# Patient Record
Sex: Female | Born: 1995 | Race: Black or African American | Hispanic: No | Marital: Single | Smoking: Never smoker
Health system: Southern US, Community
[De-identification: ages and names within clinical notes are randomized; demographics above are authoritative.]

## PROBLEM LIST (undated history)

## (undated) DIAGNOSIS — L259 Unspecified contact dermatitis, unspecified cause: Secondary | ICD-10-CM

## (undated) DIAGNOSIS — R519 Headache, unspecified: Secondary | ICD-10-CM

## (undated) DIAGNOSIS — N840 Polyp of corpus uteri: Secondary | ICD-10-CM

## (undated) DIAGNOSIS — T7840XA Allergy, unspecified, initial encounter: Secondary | ICD-10-CM

## (undated) DIAGNOSIS — L309 Dermatitis, unspecified: Secondary | ICD-10-CM

## (undated) DIAGNOSIS — K219 Gastro-esophageal reflux disease without esophagitis: Secondary | ICD-10-CM

## (undated) DIAGNOSIS — D649 Anemia, unspecified: Secondary | ICD-10-CM

## (undated) HISTORY — PX: NO PAST SURGERIES: SHX2092

## (undated) HISTORY — DX: Allergy, unspecified, initial encounter: T78.40XA

## (undated) HISTORY — DX: Dermatitis, unspecified: L30.9

---

## 2016-10-30 ENCOUNTER — Emergency Department (HOSPITAL_COMMUNITY)
Admission: EM | Admit: 2016-10-30 | Discharge: 2016-10-30 | Disposition: A | Discharge status: Home / Self Care | Payer: Commercial Managed Care - PPO | Attending: Emergency Medicine | Admitting: Emergency Medicine

## 2016-10-30 ENCOUNTER — Emergency Department (HOSPITAL_COMMUNITY): Payer: Commercial Managed Care - PPO

## 2016-10-30 ENCOUNTER — Encounter (HOSPITAL_COMMUNITY): Payer: Self-pay | Admitting: Emergency Medicine

## 2016-10-30 DIAGNOSIS — R1013 Epigastric pain: Secondary | ICD-10-CM | POA: Diagnosis present

## 2016-10-30 DIAGNOSIS — R0602 Shortness of breath: Secondary | ICD-10-CM | POA: Insufficient documentation

## 2016-10-30 DIAGNOSIS — R112 Nausea with vomiting, unspecified: Secondary | ICD-10-CM | POA: Diagnosis not present

## 2016-10-30 DIAGNOSIS — R079 Chest pain, unspecified: Secondary | ICD-10-CM | POA: Insufficient documentation

## 2016-10-30 DIAGNOSIS — R197 Diarrhea, unspecified: Secondary | ICD-10-CM

## 2016-10-30 LAB — CBC
HEMATOCRIT: 40.8 % (ref 36.0–46.0)
Hemoglobin: 14 g/dL (ref 12.0–15.0)
MCH: 29 pg (ref 26.0–34.0)
MCHC: 34.3 g/dL (ref 30.0–36.0)
MCV: 84.6 fL (ref 78.0–100.0)
PLATELETS: 339 10*3/uL (ref 150–400)
RBC: 4.82 MIL/uL (ref 3.87–5.11)
RDW: 13.4 % (ref 11.5–15.5)
WBC: 9.3 10*3/uL (ref 4.0–10.5)

## 2016-10-30 LAB — URINALYSIS, ROUTINE W REFLEX MICROSCOPIC
BILIRUBIN URINE: NEGATIVE
GLUCOSE, UA: NEGATIVE mg/dL
HGB URINE DIPSTICK: NEGATIVE
KETONES UR: 5 mg/dL — AB
Leukocytes, UA: NEGATIVE
Nitrite: NEGATIVE
Protein, ur: NEGATIVE mg/dL
SPECIFIC GRAVITY, URINE: 1.024 (ref 1.005–1.030)
pH: 5 (ref 5.0–8.0)

## 2016-10-30 LAB — COMPREHENSIVE METABOLIC PANEL
ALT: 13 U/L — AB (ref 14–54)
AST: 19 U/L (ref 15–41)
Albumin: 4.6 g/dL (ref 3.5–5.0)
Alkaline Phosphatase: 49 U/L (ref 38–126)
Anion gap: 7 (ref 5–15)
BUN: 14 mg/dL (ref 6–20)
CHLORIDE: 107 mmol/L (ref 101–111)
CO2: 27 mmol/L (ref 22–32)
CREATININE: 0.79 mg/dL (ref 0.44–1.00)
Calcium: 9.5 mg/dL (ref 8.9–10.3)
Glucose, Bld: 90 mg/dL (ref 65–99)
POTASSIUM: 3.9 mmol/L (ref 3.5–5.1)
SODIUM: 141 mmol/L (ref 135–145)
Total Bilirubin: 0.7 mg/dL (ref 0.3–1.2)
Total Protein: 8.1 g/dL (ref 6.5–8.1)

## 2016-10-30 LAB — I-STAT TROPONIN, ED: Troponin i, poc: 0 ng/mL (ref 0.00–0.08)

## 2016-10-30 LAB — LIPASE, BLOOD: LIPASE: 22 U/L (ref 11–51)

## 2016-10-30 LAB — I-STAT BETA HCG BLOOD, ED (MC, WL, AP ONLY): I-stat hCG, quantitative: <5 m[IU]/mL (ref <5)

## 2016-10-30 MED ORDER — ONDANSETRON 4 MG PO TBDP
4.0000 mg | ORAL_TABLET | Freq: Once | ORAL | Status: DC | PRN
  Filled 2016-10-30 (×2): qty 1

## 2016-10-30 MED ORDER — DICYCLOMINE HCL 20 MG PO TABS
20.0000 mg | ORAL_TABLET | Freq: Two times a day (BID) | ORAL | 0 refills | Status: DC
Start: 2016-10-30 — End: 2018-02-01

## 2016-10-30 MED ORDER — GI COCKTAIL ~~LOC~~
30.0000 mL | Freq: Once | ORAL | Status: AC
  Administered 2016-10-30: 30 mL via ORAL
  Filled 2016-10-30: qty 30

## 2016-10-30 MED ORDER — ONDANSETRON HCL 4 MG PO TABS: 4.0000 mg | ORAL_TABLET | Freq: Four times a day (QID) | ORAL | 0 refills | Status: DC

## 2016-10-30 MED ORDER — OMEPRAZOLE 20 MG PO CPDR: 20.0000 mg | DELAYED_RELEASE_CAPSULE | Freq: Every day | ORAL | 0 refills | Status: DC

## 2016-10-30 MED ORDER — DICYCLOMINE HCL 10 MG/ML IM SOLN
20.0000 mg | Freq: Once | INTRAMUSCULAR | Status: AC
  Administered 2016-10-30: 20 mg via INTRAMUSCULAR
  Filled 2016-10-30: qty 2

## 2016-10-30 MED ORDER — KETOROLAC TROMETHAMINE 30 MG/ML IJ SOLN
15.0000 mg | Freq: Once | INTRAMUSCULAR | Status: AC
  Administered 2016-10-30: 15 mg via INTRAVENOUS
  Filled 2016-10-30: qty 1

## 2016-10-30 MED ORDER — FAMOTIDINE IN NACL 20-0.9 MG/50ML-% IV SOLN
20.0000 mg | Freq: Once | INTRAVENOUS | Status: AC
  Administered 2016-10-30: 20 mg via INTRAVENOUS
  Filled 2016-10-30: qty 50

## 2016-10-30 MED ORDER — METOCLOPRAMIDE HCL 5 MG/ML IJ SOLN
10.0000 mg | Freq: Once | INTRAMUSCULAR | Status: AC
  Administered 2016-10-30: 10 mg via INTRAVENOUS
  Filled 2016-10-30: qty 2

## 2016-10-30 MED ORDER — SUCRALFATE 1 GM/10ML PO SUSP: 1.0000 g | Freq: Three times a day (TID) | ORAL | 0 refills | Status: DC

## 2016-10-30 NOTE — Discharge Instructions (Signed)
Your abdominal pain is likely from gastritis, reflux or a stomach ulcer. You will need to take the prescribed proton pump inhibitor as directed, and avoid spicy/fatty/acidic foods. Avoid laying down flat within 30 minutes of eating. Avoid NSAIDs like ibuprofen or Aleve on an empty stomach. Use zofran as needed for nausea. Follow up with the gastroenterologist (GI doctor) listed for ongoing evaluation of your abdominal pain. Return to the ER for new or worsening symptoms, any additional concers.   Take the Bentyl for abdominal cramps. Take the Prilosec and Carafate as prescribed.  Her lab work and imaging has been reassuring.  Please follow-up with her primary care doctor in regards to today's visit. Return to the ED if he develop worsening chest pain, shortness of breath or for any reason.   SEEK IMMEDIATE MEDICAL ATTENTION IF YOU DEVELOP ANY OF THE FOLLOWING SYMPTOMS: The pain does not go away or becomes severe.  A temperature above 101 develops.  Repeated vomiting occurs (multiple episodes).  Blood is being passed in stools or vomit (bright red or black tarry stools).  Return also if you develop chest pain, difficulty breathing, dizziness or fainting

## 2016-10-30 NOTE — ED Provider Notes (Signed)
[facility]-EMERGENCY DEPT Provider Note   CSN: [id number] Arrival date & time: 10/30/16  0604     History   Chief Complaint Chief Complaint  Patient presents with  . Abdominal Pain  . Emesis    HPI Misty Estrada is a 21 y.o. female.  HPI  21 year old African-American female with no significant past medical history presents to the ED today with complaints of epigastric abdominal pain, chest pain, shortness of breath, nausea, emesis, diarrhea. Patient states that yesterday she ate cheese cake and shortly after she started developing non bloody non bilious emesis and diarrhea. Patient states she had 2 episodes of non bloody diarrhea last night. She also reports 2 episodes of emesis. She has not taken any for her symptoms prior to arrival. Has had poor by mouth intake due to the nausea and diarrhea. Patient states the pain is located in her epigastric and right upper quadrant region. Also radiates to her chest. Patient states after she vomited last night she had burning in her chest. This was substernal. Did not radiate. Not associated with exertion. Not pleuritic in nature. She reports associated mild shortness of breath, but denies any symptoms at this time. Patient has no cardiac history. Denies any history of PE/DVT, prolonged immobilizations, recent hospitalizations/surgeries, tobacco use, OCP use.   Patient denies any sick contacts, travel outside the [place], antibiotic use.  Pt denies any fever, chill, ha, vision changes, lightheadedness, dizziness, congestion, neck pain, cough, , urinary symptoms, melena, hematochezia, lower extremity paresthesias.  History reviewed. No pertinent past medical history.  There are no active problems to display for this patient.   History reviewed. No pertinent surgical history.  OB History    No data available       Home Medications    Prior to Admission medications   Medication Sig Start Date End Date Taking? Authorizing Provider  dicyclomine  (BENTYL) 20 MG tablet Take 1 tablet (20 mg total) by mouth 2 (two) times daily. 10/30/16   Rise Mu, PA-C  omeprazole (PRILOSEC) 20 MG capsule Take 1 capsule (20 mg total) by mouth daily. 10/30/16   Demetrios Loll T, PA-C  ondansetron (ZOFRAN) 4 MG tablet Take 1 tablet (4 mg total) by mouth every 6 (six) hours. 10/30/16   Rise Mu, PA-C  sucralfate (CARAFATE) 1 GM/10ML suspension Take 10 mLs (1 g total) by mouth 4 (four) times daily -  with meals and at bedtime. 10/30/16   Rise Mu, PA-C    Family History History reviewed. No pertinent family history.  Social History Social History  Substance Use Topics  . Smoking status: Never Smoker  . Smokeless tobacco: Never Used  . Alcohol use No     Allergies   Grass extracts [gramineae pollens]   Review of Systems Review of Systems  Constitutional: Negative for chills and fever.  HENT: Negative for congestion.   Eyes: Negative for visual disturbance.  Respiratory: Positive for shortness of breath. Negative for cough and wheezing.   Cardiovascular: Positive for chest pain. Negative for palpitations and leg swelling.  Gastrointestinal: Positive for abdominal pain, diarrhea, nausea and vomiting. Negative for blood in stool and constipation.  Genitourinary: Negative for dysuria, flank pain, frequency, hematuria, urgency, vaginal bleeding and vaginal discharge.  Musculoskeletal: Negative for arthralgias and myalgias.  Skin: Negative for rash.  Neurological: Negative for dizziness, syncope, weakness, light-headedness, numbness and headaches.  Psychiatric/Behavioral: Negative for sleep disturbance. The patient is not nervous/anxious.      Physical Exam Updated Vital Signs BP  111/74 (BP Location: Right Arm)   Pulse 72   Temp 98.3 F (36.8 C) (Oral)   Resp 16   Ht 5' (1.524 m)   Wt 72.6 kg (160 lb)   LMP 10/23/2016   SpO2 100%   BMI 31.25 kg/m   Physical Exam  Constitutional: She is oriented to  person, place, and time. She appears well-developed and well-nourished.  Non-toxic appearance. No distress.  HENT:  Head: Normocephalic and atraumatic.  Nose: Nose normal.  Mouth/Throat: Oropharynx is clear and moist.  Eyes: Pupils are equal, round, and reactive to light. Conjunctivae are normal. Right eye exhibits no discharge. Left eye exhibits no discharge.  Neck: Normal range of motion. Neck supple. No JVD present. No tracheal deviation present.  Cardiovascular: Normal rate, regular rhythm, normal heart sounds and intact distal pulses.  Exam reveals no gallop and no friction rub.   No murmur heard. Pulmonary/Chest: Effort normal and breath sounds normal. No respiratory distress. She has no wheezes. She has no rales. She exhibits no tenderness.  No hypoxia or tachypnea.  Abdominal: Soft. She exhibits no distension. Bowel sounds are increased. There is tenderness in the right upper quadrant and epigastric area. There is no rigidity, no rebound, no guarding, no CVA tenderness, no tenderness at McBurney's point and negative Murphy's sign.  Musculoskeletal: Normal range of motion. She exhibits no tenderness.  No lower extremity edema or calf tenderness.  Lymphadenopathy:    She has no cervical adenopathy.  Neurological: She is alert and oriented to person, place, and time.  Skin: Skin is warm and dry. Capillary refill takes less than 2 seconds. She is not diaphoretic.  Psychiatric: Her behavior is normal. Judgment and thought content normal.  Nursing note and vitals reviewed.    ED Treatments / Results  Labs (all labs ordered are listed, but only abnormal results are displayed) Labs Reviewed  COMPREHENSIVE METABOLIC PANEL - Abnormal; Notable for the following:       Result Value   ALT 13 (*)    All other components within normal limits  URINALYSIS, ROUTINE W REFLEX MICROSCOPIC - Abnormal; Notable for the following:    APPearance HAZY (*)    Ketones, ur 5 (*)    All other components  within normal limits  LIPASE, BLOOD  CBC  I-STAT BETA HCG BLOOD, ED ([facility], [facility], [facility] ONLY)  I-STAT TROPONIN, ED    EKG  EKG Interpretation  Date/Time:  Monday October 30 2016 09:42:18 EDT Ventricular Rate:  59 PR Interval:    QRS Duration: 83 QT Interval:  378 QTC Calculation: 375 R Axis:   83 Text Interpretation:  Sinus rhythm Confirmed by Lorre Nick ([id number]) on 10/30/2016 10:25:05 AM       Radiology Dg Chest 2 View  Result Date: 10/30/2016 CLINICAL DATA:  Mid chest pain and shortness of breath last night. Some nausea. Nonsmoker. EXAM: CHEST  2 VIEW COMPARISON:  None in PACs FINDINGS: The lungs are adequately inflated. The perihilar lung markings are mildly prominent. There is no alveolar infiltrate or pleural effusion. The heart and pulmonary vascularity are normal. The trachea is midline. The bony thorax is unremarkable. IMPRESSION: No definite acute cardiopulmonary abnormality. Mild perihilar interstitial prominence may be normal for the patient but could reflect subsegmental atelectasis as might be seen with acute bronchitis. Follow-up radiographs are recommended if the patient's symptoms persist. Electronically Signed   By: David  [place] M.D.   On: 10/30/2016 09:32   US Abdomen Complete  Result Date: 10/30/2016 CLINICAL DATA:  Epigastric and right upper quadrant pain EXAM: ABDOMEN ULTRASOUND COMPLETE COMPARISON:  None. FINDINGS: Gallbladder: No gallstones or wall thickening visualized. No sonographic Murphy sign noted by sonographer. Common bile duct: Diameter: 2.5 mm Liver: No focal lesion identified. Within normal limits in parenchymal echogenicity. Portal vein is patent on color Doppler imaging with normal direction of blood flow towards the liver. IVC: No abnormality visualized. Pancreas: Visualized portion unremarkable. Spleen: Size and appearance within normal limits. Right Kidney: Length: 9.6 cm. Echogenicity within normal limits. No mass or hydronephrosis visualized. Left  Kidney: Length: 9.7 cm. Echogenicity within normal limits. No mass or hydronephrosis visualized. Abdominal aorta: Obscured by bowel gas.  No aneurysm visualized. Other findings: No free fluid or ascites. IMPRESSION: No acute or significant finding by abdominal ultrasound. Electronically Signed   By: Judie Petit.  Shick M.D.   On: 10/30/2016 10:08    Procedures Procedures (including critical care time)  Medications Ordered in ED Medications  ondansetron (ZOFRAN-ODT) disintegrating tablet 4 mg (not administered)  gi cocktail (Maalox,Lidocaine,Donnatal) (30 mLs Oral Given 10/30/16 0857)  famotidine (PEPCID) IVPB 20 mg premix (0 mg Intravenous Stopped 10/30/16 0927)  dicyclomine (BENTYL) injection 20 mg (20 mg Intramuscular Given 10/30/16 0857)  metoCLOPramide (REGLAN) injection 10 mg (10 mg Intravenous Given 10/30/16 0857)  ketorolac (TORADOL) 30 MG/ML injection 15 mg (15 mg Intravenous Given 10/30/16 1125)     Initial Impression / Assessment and Plan / ED Course  I have reviewed the triage vital signs and the nursing notes.  Pertinent labs & imaging results that were available during my care of the patient were reviewed by me and considered in my medical decision making (see chart for details).      Patient presents to the ED with complaints of epigastric abdominal pain, nausea, emesis, diarrhea. She also reports associated chest pain or shortness of breath.Patient with symptoms consistent with viral gastroenteritis.  Vitals are stable, no fever.  No signs of dehydration, tolerating PO fluids > 6 oz.  Lungs are clear.  No focal abdominal pain, no concern for appendicitis, cholecystitis, pancreatitis, ruptured viscus, UTI, kidney stone, or any other abdominal etiology.   No leukocytosis. Lipase is normal. Pregnancy test is negative. UA shows no signs of infection. Patient's symptoms improved with Pepcid, GI cocktail, Bentyl. No focal abdominal tenderness that would be concerning for peritonitis.   Patient  does report some chest pain or shortness of breath after vomiting last night. Chest x-ray shows some mild peribronchial thickening likely secondary to bronchitis however patient denies any cough. No fever. Likely nonspecific. EKG was normal sinus rhythm. Troponin negative. Patient has PERC  Negative. Patient's symptoms does not seem consistent with PE, ACS, dissection, pneumonia. Likely GERD from emesis. She will need close follow-up with her PCP.   Patient feels much improved and ready for discharge. Vital signs remained reassuring. Repeat abdominal exam shows no abdominal tenderness. She is able tolerate by mouth fluids and food without difficulties.  Pt is hemodynamically stable, in NAD, & able to ambulate in the ED. Evaluation does not show pathology that would require ongoing emergent intervention or inpatient treatment. I explained the diagnosis to the patient. Pain has been managed & has no complaints prior to dc. Pt is comfortable with above plan and is stable for discharge at this time. All questions were answered prior to disposition. Strict return precautions for f/u to the ED were discussed. Encouraged follow up with PCP.   Final Clinical Impressions(s) / ED Diagnoses   Final diagnoses:  Nausea vomiting and  diarrhea  Epigastric abdominal pain  Chest pain, unspecified type  SOB (shortness of breath)    New Prescriptions New Prescriptions   DICYCLOMINE (BENTYL) 20 MG TABLET    Take 1 tablet (20 mg total) by mouth 2 (two) times daily.   OMEPRAZOLE (PRILOSEC) 20 MG CAPSULE    Take 1 capsule (20 mg total) by mouth daily.   ONDANSETRON (ZOFRAN) 4 MG TABLET    Take 1 tablet (4 mg total) by mouth every 6 (six) hours.   SUCRALFATE (CARAFATE) 1 GM/10ML SUSPENSION    Take 10 mLs (1 g total) by mouth 4 (four) times daily -  with meals and at bedtime.     Rise Mu, PA-C 10/30/16 1242    Lorre Nick, MD 10/31/16 [phone]

## 2016-10-30 NOTE — ED Triage Notes (Signed)
Patient complaining of abdominal pain. Patient states she had cheesecake yesterday and after she ate. After she ate it she threw up twice. Patient was having abdominal pain today and yesterday. The pain is in the upper left and right. Patient states she still feel nauseas.

## 2016-10-30 NOTE — ED Notes (Signed)
Bed: WA21 Expected date:  Expected time:  Means of arrival:  Comments: Triage 2 

## 2016-10-30 NOTE — ED Notes (Signed)
Pt's labs for troponin=0.00                       Beta hcg= <5.0  Communication error with information crossing over in machine.

## 2017-11-23 ENCOUNTER — Emergency Department (HOSPITAL_COMMUNITY)
Admission: EM | Admit: 2017-11-23 | Discharge: 2017-11-24 | Disposition: A | Discharge status: Home / Self Care | Payer: Commercial Managed Care - PPO | Attending: Emergency Medicine | Admitting: Emergency Medicine

## 2017-11-23 ENCOUNTER — Encounter (HOSPITAL_COMMUNITY): Payer: Self-pay

## 2017-11-23 ENCOUNTER — Other Ambulatory Visit: Payer: Self-pay

## 2017-11-23 DIAGNOSIS — Z79899 Other long term (current) drug therapy: Secondary | ICD-10-CM | POA: Insufficient documentation

## 2017-11-23 DIAGNOSIS — R21 Rash and other nonspecific skin eruption: Secondary | ICD-10-CM | POA: Diagnosis present

## 2017-11-23 NOTE — ED Triage Notes (Signed)
Pt presents to ED from home for rash. Pt reports that the rash is on her breasts and face. Pt was given abx from [facility] for the rash, but reports that it has gotten worse. Pt also reports that she has been feeling very fatigued.

## 2017-11-24 MED ORDER — MUPIROCIN CALCIUM 2 % EX CREA: 1.0000 "application " | TOPICAL_CREAM | Freq: Two times a day (BID) | CUTANEOUS | 0 refills | Status: DC

## 2017-11-24 NOTE — Discharge Instructions (Addendum)
Use Bactroban topically twice daily. Call one of the dermatology offices provided to schedule an appointment for further evaluation of persistent rash.

## 2017-11-24 NOTE — ED Provider Notes (Signed)
[facility]-EMERGENCY DEPT Provider Note   CSN: [id number] Arrival date & time: 11/23/17  2109     History   Chief Complaint Chief Complaint  Patient presents with  . Rash  . Fatigue    HPI Misty Estrada is a 22 y.o. female.  Patient presents with rash to bilateral breasts for greater than one month. The rash is painful and involves areola and immediate surrounding areas. It is painful, does not itch. There is leakage from the rash intermittently. She was seen by [facility] numerous times for same, and placed on antibiotics for a one week trial which did not resolve the rash. There was minimal improvement, then worsening after course was completed. No fever, swelling, nipple discharge.   The history is provided by the patient. No language interpreter was used.    History reviewed. No pertinent past medical history.  There are no active problems to display for this patient.   History reviewed. No pertinent surgical history.   OB History   None      Home Medications    Prior to Admission medications   Medication Sig Start Date End Date Taking? Authorizing Provider  dicyclomine (BENTYL) 20 MG tablet Take 1 tablet (20 mg total) by mouth 2 (two) times daily. 10/30/16   Rise MuLeaphart, Kenneth T, PA-C  omeprazole (PRILOSEC) 20 MG capsule Take 1 capsule (20 mg total) by mouth daily. 10/30/16   Demetrios LollLeaphart, Kenneth T, PA-C  ondansetron (ZOFRAN) 4 MG tablet Take 1 tablet (4 mg total) by mouth every 6 (six) hours. 10/30/16   Rise MuLeaphart, Kenneth T, PA-C  sucralfate (CARAFATE) 1 GM/10ML suspension Take 10 mLs (1 g total) by mouth 4 (four) times daily -  with meals and at bedtime. 10/30/16   Rise MuLeaphart, Kenneth T, PA-C    Family History History reviewed. No pertinent family history.  Social History Social History   Tobacco Use  . Smoking status: Never Smoker  . Smokeless tobacco: Never Used  Substance Use Topics  . Alcohol use: No  . Drug use: No     Allergies     Grass extracts [gramineae pollens]   Review of Systems Review of Systems  Constitutional: Negative for fever and unexpected weight change.  Cardiovascular: Negative for chest pain.  Skin: Positive for rash.     Physical Exam Updated Vital Signs BP 118/80   Pulse 78   Temp 98.6 F (37 C) (Oral)   Resp (!) 71   Ht 5' (1.524 m)   Wt 79.4 kg   SpO2 100%   BMI 34.18 kg/m   Physical Exam  Constitutional: She is oriented to person, place, and time. She appears well-developed and well-nourished.  Neck: Normal range of motion.  Pulmonary/Chest: Effort normal.  Neurological: She is alert and oriented to person, place, and time.  Skin: Skin is warm and dry.  Rash to bilateral breasts involving areola and surrounding area. Raised, erythematous, drainage present. No nipple discharge/bleeding.     ED Treatments / Results  Labs (all labs ordered are listed, but only abnormal results are displayed) Labs Reviewed - No data to display  EKG None  Radiology No results found.  Procedures Procedures (including critical care time)  Medications Ordered in ED Medications - No data to display   Initial Impression / Assessment and Plan / ED Course  I have reviewed the triage vital signs and the nursing notes.  Pertinent labs & imaging results that were available during my care of the patient were reviewed  by me and considered in my medical decision making (see chart for details).     Rash to breast bilaterally. Will give Bactroban and strongly encourage dermatology follow up for definite testing and diagnosis.   Final Clinical Impressions(s) / ED Diagnoses   Final diagnoses:  None   1. Breast rash  ED Discharge Orders    None       Danne Harbor 11/24/17 0105    Palumbo, April, MD 11/24/17 0126

## 2017-12-06 ENCOUNTER — Emergency Department (HOSPITAL_COMMUNITY)
Admission: EM | Admit: 2017-12-06 | Discharge: 2017-12-06 | Disposition: A | Discharge status: Home / Self Care | Payer: Commercial Managed Care - PPO | Attending: Emergency Medicine | Admitting: Emergency Medicine

## 2017-12-06 ENCOUNTER — Encounter (HOSPITAL_COMMUNITY): Payer: Self-pay

## 2017-12-06 ENCOUNTER — Other Ambulatory Visit: Payer: Self-pay

## 2017-12-06 DIAGNOSIS — L299 Pruritus, unspecified: Secondary | ICD-10-CM | POA: Insufficient documentation

## 2017-12-06 DIAGNOSIS — R21 Rash and other nonspecific skin eruption: Secondary | ICD-10-CM

## 2017-12-06 LAB — CBC WITH DIFFERENTIAL/PLATELET
Basophils Absolute: 0 10*3/uL (ref 0.0–0.1)
Basophils Relative: 1 %
EOS ABS: 0.2 10*3/uL (ref 0.0–0.7)
EOS PCT: 4 %
HCT: 42.8 % (ref 36.0–46.0)
Hemoglobin: 14.2 g/dL (ref 12.0–15.0)
LYMPHS ABS: 1.7 10*3/uL (ref 0.7–4.0)
Lymphocytes Relative: 28 %
MCH: 29.2 pg (ref 26.0–34.0)
MCHC: 33.2 g/dL (ref 30.0–36.0)
MCV: 87.9 fL (ref 78.0–100.0)
MONOS PCT: 8 %
Monocytes Absolute: 0.5 10*3/uL (ref 0.1–1.0)
Neutro Abs: 3.6 10*3/uL (ref 1.7–7.7)
Neutrophils Relative %: 59 %
PLATELETS: 410 10*3/uL — AB (ref 150–400)
RBC: 4.87 MIL/uL (ref 3.87–5.11)
RDW: 13.2 % (ref 11.5–15.5)
WBC: 6 10*3/uL (ref 4.0–10.5)

## 2017-12-06 LAB — BASIC METABOLIC PANEL
Anion gap: 9 (ref 5–15)
BUN: 18 mg/dL (ref 6–20)
CHLORIDE: 109 mmol/L (ref 98–111)
CO2: 28 mmol/L (ref 22–32)
CREATININE: 0.91 mg/dL (ref 0.44–1.00)
Calcium: 9.6 mg/dL (ref 8.9–10.3)
GFR calc Af Amer: >60 mL/min (ref >60)
GFR calc non Af Amer: >60 mL/min (ref >60)
Glucose, Bld: 86 mg/dL (ref 70–99)
Potassium: 3.9 mmol/L (ref 3.5–5.1)
Sodium: 146 mmol/L — ABNORMAL HIGH (ref 135–145)

## 2017-12-06 LAB — C-REACTIVE PROTEIN: CRP: <0.8 mg/dL (ref <1.0)

## 2017-12-06 LAB — SEDIMENTATION RATE: SED RATE: 1 mm/h (ref 0–22)

## 2017-12-06 MED ORDER — PREDNISONE 20 MG PO TABS: 60.0000 mg | ORAL_TABLET | Freq: Every day | ORAL | 0 refills | Status: AC

## 2017-12-06 MED ORDER — PREDNISONE 20 MG PO TABS: 60.0000 mg | ORAL_TABLET | Freq: Every day | ORAL | 0 refills | Status: DC

## 2017-12-06 MED ORDER — PREDNISONE 20 MG PO TABS
60.0000 mg | ORAL_TABLET | Freq: Once | ORAL | Status: AC
  Administered 2017-12-06: 60 mg via ORAL
  Filled 2017-12-06: qty 3

## 2017-12-06 NOTE — ED Provider Notes (Signed)
[facility] DEPT Provider Note   CSN: [id number] Arrival date & time: 12/06/17  1256     History   Chief Complaint Chief Complaint  Patient presents with  . Rash    HPI Misty Estrada is a 22 y.o. female.  Misty Estrada is a 22 y.o. Female who is otherwise healthy, presents to the emergency department for evaluation of trunk over bilateral breasts and around both eyes.  The rash started approximately 3 months ago on her breasts and has gotten progressively worse.  She reports the area is itchy and her skin becomes hard and raised and cracked, and she has noted some clear drainage from the rash.  She reports the inflammation and puffiness around her eyes started about a month ago and has been getting progressively worse as well and it seems that the rash on her breasts is starting to move down her trunk somewhat.  She reports she has been seen by [facility] as well as here in the emergency department about 2 weeks ago, was initially treated with clindamycin with minimal improvement she was also provided Bactroban ointment which did not seem to resolve.  Patient has not been put on any steroids or antifungals.  She has been referred to primary care and dermatology, has a dermatology appointment next week on October 4 and a PCP appointment the following week but rash continues to progress.  She has not had any blood work or further evaluation done.  No family history of similar rashes, lupus or other autoimmune diseases that she knows of.  She does have history of eczema on her hands and arms that flares up intermittently and this has been worsening recently as well.  The rash on her breast is not painful and is not warm to the touch.  She has not had any fevers or chills, no nausea or vomiting, no other systemic symptoms such as myalgias or fatigue.     History reviewed. No pertinent past medical history.  There are no active problems to display for this  patient.   History reviewed. No pertinent surgical history.   OB History   None      Home Medications    Prior to Admission medications   Medication Sig Start Date End Date Taking? Authorizing Provider  dicyclomine (BENTYL) 20 MG tablet Take 1 tablet (20 mg total) by mouth 2 (two) times daily. Patient not taking: Reported on 11/24/2017 10/30/16   Ocie Cornfield T, PA-C  ibuprofen (ADVIL,MOTRIN) 200 MG tablet Take 400 mg by mouth every 6 (six) hours as needed for moderate pain.    [provider]  mupirocin cream (BACTROBAN) 2 % Apply 1 application topically 2 (two) times daily. 11/24/17   Charlann Lange, PA-C  omeprazole (PRILOSEC) 20 MG capsule Take 1 capsule (20 mg total) by mouth daily. Patient not taking: Reported on 11/24/2017 10/30/16   Ocie Cornfield T, PA-C  ondansetron (ZOFRAN) 4 MG tablet Take 1 tablet (4 mg total) by mouth every 6 (six) hours. Patient not taking: Reported on 11/24/2017 10/30/16   Ocie Cornfield T, PA-C  sucralfate (CARAFATE) 1 GM/10ML suspension Take 10 mLs (1 g total) by mouth 4 (four) times daily -  with meals and at bedtime. Patient not taking: Reported on 11/24/2017 10/30/16   Doristine Devoid, PA-C    Family History Family History  Problem Relation Age of Onset  . Hypertension Mother     Social History Social History   Tobacco Use  . Smoking  status: Never Smoker  . Smokeless tobacco: Never Used  Substance Use Topics  . Alcohol use: No  . Drug use: No     Allergies   Grass extracts [gramineae pollens]   Review of Systems Review of Systems  Constitutional: Negative for chills and fever.  HENT: Negative for facial swelling.   Eyes: Negative for pain, redness and visual disturbance.  Respiratory: Negative for cough and shortness of breath.   Gastrointestinal: Negative for abdominal pain, nausea and vomiting.  Musculoskeletal: Negative for arthralgias and myalgias.  Skin: Positive for rash. Negative for color change.    All other systems reviewed and are negative.    Physical Exam Updated Vital Signs BP 117/77   Pulse (!) 103   Temp 99.4 F (37.4 C) (Oral)   Resp 16   Ht 5' (1.524 m)   Wt 78.5 kg   LMP 11/22/2017 (Approximate)   SpO2 97%   BMI 33.79 kg/m   Physical Exam  Constitutional: She appears well-developed and well-nourished. No distress.  HENT:  Head: Normocephalic and atraumatic.  No facial swelling  Eyes: Right eye exhibits no discharge. Left eye exhibits no discharge.  Rash surrounding both eyes, does not appear to involve the eye, no scleral ingestion, PERRLA, and EOMi, no consentual pain  Neck: Neck supple.  Cardiovascular: Normal rate, regular rhythm, normal heart sounds and intact distal pulses.  Pulmonary/Chest: Effort normal and breath sounds normal. No respiratory distress.  Respirations equal and unlabored, patient able to speak in full sentences, lungs clear to auscultation bilaterally  Abdominal: Soft. Bowel sounds are normal. She exhibits no distension. There is no tenderness.  Neurological: She is alert. Coordination normal.  Skin: Skin is warm and dry. Capillary refill takes less than 2 seconds. Rash noted. She is not diaphoretic.  Clinical photos obtained below with pt consent, raised rash over both breasts surrounding nipple ad areola, and rash surrounding both eyes, small amout of clear drainage noted from rash on breasts, no crusting or vesciles, rash is not warm to the touch, no purulent drainage, cellulitis or fluctuance  Psychiatric: She has a normal mood and affect. Her behavior is normal.  Nursing note and vitals reviewed.        ED Treatments / Results  Labs (all labs ordered are listed, but only abnormal results are displayed) Labs Reviewed  BASIC METABOLIC PANEL - Abnormal; Notable for the following components:      Result Value   Sodium 146 (*)    All other components within normal limits  CBC WITH DIFFERENTIAL/PLATELET - Abnormal; Notable for  the following components:   Platelets 410 (*)    All other components within normal limits  SEDIMENTATION RATE  C-REACTIVE PROTEIN    EKG None  Radiology No results found.  Procedures Procedures (including critical care time)  Medications Ordered in ED Medications  predniSONE (DELTASONE) tablet 60 mg (60 mg Oral Given 12/06/17 1438)     Initial Impression / Assessment and Plan / ED Course  I have reviewed the triage vital signs and the nursing notes.  Pertinent labs & imaging results that were available during my care of the patient were reviewed by me and considered in my medical decision making (see chart for details).  Rash to bilateral breasts ans surrounding both eyes, unclear etiology, likely inflammatory. Rash on breasts does have some clear drainage. Patient denies any difficulty breathing or swallowing.  Pt has a patent airway without stridor and is handling secretions without difficulty; no angioedema. No blisters,  no pustules, no warmth, no draining sinus tracts, no superficial abscesses, no bullous impetigo, no vesicles, no desquamation. Not tender to touch. Does nto appear to have secondary infection. No concern for SJS, TEN, TSS, or other life-threatening condition.  Has been trialed on topical and systemic antibiotics which have not been successful and rash appears to be inflammatory will trial patient on course of steroids.  I have sent basic labs as well as ESR and CRP to assist dermatologist and further evaluation but do not think patient will need to stay for these results.  Will discharge home with short course of steroids, she is to follow-up with dermatologist as planned next week.  Return precautions discussed.  Patient expresses understanding and is in agreement with this plan.  Stable for discharge home.  Patient discussed with Dr. Maryan Rued, who saw patient as well and agrees with plan.   Final Clinical Impressions(s) / ED Diagnoses   Final diagnoses:  Rash      ED Discharge Orders         Ordered    predniSONE (DELTASONE) 20 MG tablet  Daily     12/06/17 1428           Jacqlyn Larsen, [place] 12/06/17 1722    Blanchie Dessert, MD 12/06/17 2135

## 2017-12-06 NOTE — ED Triage Notes (Signed)
Patient c/o rash on her trunk , face, breasts, and eyes. Patient states the rash started 3 months and has gotten progressively worse in the past week.

## 2017-12-06 NOTE — Discharge Instructions (Signed)
Unsure the exact cause of this rash but I think it is likely inflammatory and I would like to try a course of steroids to treat it.  You had lab work sent off today which will be available to your dermatologist when you see them next week, you can also you these results through my chart.  Please take steroids as directed you can continue to use Bactroban cream if you wish although rash does not appear to be infected today, keep the area moisturized and moist to help prevent breaks and cracks in the skin.  If you develop fever or significant worsening or change in rash or any other new or concerning symptoms he may return to the emergency department otherwise follow-up as planned.

## 2018-01-01 ENCOUNTER — Encounter: Payer: Self-pay | Admitting: Family Medicine

## 2018-01-01 ENCOUNTER — Ambulatory Visit (INDEPENDENT_AMBULATORY_CARE_PROVIDER_SITE_OTHER): Payer: Commercial Managed Care - PPO | Admitting: Family Medicine

## 2018-01-01 ENCOUNTER — Other Ambulatory Visit: Payer: Self-pay

## 2018-01-01 VITALS — BP 119/74 | HR 80 | Temp 98.3°F | Resp 16 | Ht 60.0 in | Wt 181.8 lb

## 2018-01-01 DIAGNOSIS — H01134 Eczematous dermatitis of left upper eyelid: Secondary | ICD-10-CM

## 2018-01-01 DIAGNOSIS — L089 Local infection of the skin and subcutaneous tissue, unspecified: Secondary | ICD-10-CM | POA: Diagnosis not present

## 2018-01-01 DIAGNOSIS — H01132 Eczematous dermatitis of right lower eyelid: Secondary | ICD-10-CM | POA: Diagnosis not present

## 2018-01-01 DIAGNOSIS — H01131 Eczematous dermatitis of right upper eyelid: Secondary | ICD-10-CM

## 2018-01-01 DIAGNOSIS — B958 Unspecified staphylococcus as the cause of diseases classified elsewhere: Secondary | ICD-10-CM

## 2018-01-01 DIAGNOSIS — H01135 Eczematous dermatitis of left lower eyelid: Secondary | ICD-10-CM

## 2018-01-01 NOTE — Progress Notes (Signed)
Chief Complaint  Patient presents with  . 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  Current Outpatient Medications  Medication Sig Dispense Refill  . cetirizine (ZYRTEC) 10 MG tablet Take 1 tablet (10 mg total) by mouth daily. 30 tablet 11  . dicyclomine (BENTYL) 20 MG tablet Take 1 tablet (20 mg total) by mouth 2 (two) times daily. (Patient not taking: Reported on 01/01/2018) 20 tablet 0  . ibuprofen (ADVIL,MOTRIN) 200 MG tablet Take 400 mg by mouth every 6 (six) hours as needed for moderate pain.    . montelukast (SINGULAIR) 10 MG tablet Take 1 tablet (10 mg total) by mouth at bedtime. 30 tablet 11  . mupirocin cream (BACTROBAN) 2 % Apply 1 application topically 2 (two) times daily. (Patient not taking: Reported on 01/01/2018) 15 g 0  . omeprazole (PRILOSEC) 20 MG capsule Take 1 capsule (20 mg total) by mouth daily. (Patient not taking: Reported on 11/24/2017) 15 capsule 0  . ondansetron (ZOFRAN) 4 MG tablet Take 1 tablet (4 mg total) by mouth every 6 (six) hours. (Patient not taking: Reported on 11/24/2017) 8 tablet 0  . sucralfate (CARAFATE) 1 GM/10ML suspension Take 10 mLs (1 g  total) by mouth 4 (four) times daily -  with meals and at bedtime. (Patient not taking: Reported on 11/24/2017) 420 mL 0   No current facility-administered medications for this visit.     Allergies:  Allergies  Allergen Reactions  . Grass Extracts [Gramineae Pollens]     Sneezing     No past surgical history on file.  Social History   Socioeconomic History  . Marital status: Single    Spouse name: Not on file  . Number of children: Not on file  . Years of education: Not on file  . Highest education level: Not on file  Occupational History  . Not on file  Social Needs  . Financial resource strain: Not on file  . Food insecurity:    Worry: Not on file    Inability: Not on file  . Transportation needs:    Medical: Not on file    Non-medical: Not on file  Tobacco Use  . Smoking status: Never Smoker  . Smokeless tobacco: Never Used  Substance and Sexual Activity  . Alcohol use: No  . Drug use: No  . Sexual activity: Not Currently  Lifestyle  . Physical activity:    Days per week: Not on file    Minutes per session: Not on file  . Stress: Not on file  Relationships  . Social connections:    Talks on  phone: Not on file    Gets together: Not on file    Attends religious service: Not on file    Active member of club or organization: Not on file    Attends meetings of clubs or organizations: Not on file    Relationship status: Not on file  Other Topics Concern  . Not on file  Social History Narrative  . Not on file    Family History  Problem Relation Age of Onset  . Hypertension Mother      ROS Review of Systems See HPI Constitution: No fevers or chills No malaise No diaphoresis Skin: No rash or itching Eyes: no blurry vision, no double vision GU: no dysuria or hematuria Neuro: no dizziness or headaches * all others reviewed and negative   Objective: Vitals:   01/01/18 1027  BP: 119/74  Pulse: 80  Resp: 16  Temp: 98.3 F (36.8 C)  TempSrc: Oral   SpO2: 99%  Weight: 181 lb 12.8 oz (82.5 kg)  Height: 5' (1.524 m)    Physical Exam General: alert, oriented, in NAD Head: normocephalic, atraumatic, no sinus tenderness Eyes: EOM intact, no scleral icterus or conjunctival injection Ears: TM clear bilaterally Nose: mucosa nonerythematous, nonedematous Throat: no pharyngeal exudate or erythema Lymph: no posterior auricular, submental or cervical lymph adenopathy Heart: normal rate, normal sinus rhythm, no murmurs Lungs: clear to auscultation bilaterally, no wheezing  Skin Rashes on breast, scalp, neck  Assessment and Plan Misty Estrada was seen today for new patient (initial visit).  Diagnoses and all orders for this visit:  Eczematous dermatitis of upper and lower eyelids of both eyes -     Cancel: Allergy Panel, Region 2, Grasses -     Food Allergy Profile -     Ambulatory referral to Dermatology -     Allergens, Zone 2  Staphylococcal infection of skin -     Ambulatory referral to Dermatology -     Allergens, Zone 2     Misty Estrada

## 2018-01-01 NOTE — Patient Instructions (Signed)
° ° ° °  If you have lab work done today you will be contacted with your lab results within the next 2 weeks.  If you have not heard from us then please contact us. The fastest way to get your results is to register for My Chart. ° ° °IF you received an x-ray today, you will receive an invoice from [facility]. Please contact [facility] at [phone] with questions or concerns regarding your invoice.  ° °IF you received labwork today, you will receive an invoice from [facility]. Please contact [facility] at [phone] with questions or concerns regarding your invoice.  ° °Our billing staff will not be able to assist you with questions regarding bills from these companies. ° °You will be contacted with the lab results as soon as they are available. The fastest way to get your results is to activate your My Chart account. Instructions are located on the last page of this paperwork. If you have not heard from us regarding the results in 2 weeks, please contact this office. °  ° ° ° °

## 2018-01-03 LAB — FOOD ALLERGY PROFILE
Allergen Corn, IgE: 0.35 kU/L — AB
Clam IgE: <0.1 kU/L
Codfish IgE: <0.1 kU/L
Egg White IgE: 0.3 kU/L — AB
MILK IGE: 0.27 kU/L — AB
PEANUT IGE: 0.61 kU/L — AB
SESAME SEED IGE: 1 kU/L — AB
SOYBEAN IGE: 0.19 kU/L — AB
Scallop IgE: 0.18 kU/L — AB
Shrimp IgE: 0.13 kU/L — AB
Walnut IgE: 0.15 kU/L — AB
Wheat IgE: 0.48 kU/L — AB

## 2018-01-04 ENCOUNTER — Other Ambulatory Visit: Payer: Self-pay | Admitting: Family Medicine

## 2018-01-04 LAB — ALLERGENS, ZONE 2
Amer Sycamore IgE Qn: 0.81 kU/L — AB
Bahia Grass IgE: 5.19 kU/L — AB
Bermuda Grass IgE: 1.95 kU/L — AB
Cat Dander IgE: 0.61 kU/L — AB
Cladosporium Herbarum IgE: 0.39 kU/L — AB
Cockroach, American IgE: <0.1 kU/L
Common Silver Birch IgE: 0.54 kU/L — AB
D Farinae IgE: 6.01 kU/L — AB
D001-IGE D PTERONYSSINUS: 6.15 kU/L — AB
Dog Dander IgE: 0.29 kU/L — AB
G010-IGE JOHNSON GRASS: 5.08 kU/L — AB
M003-IGE ASPERGILLUS FUMIGATUS: 0.4 kU/L — AB
M006-IGE ALTERNARIA ALTERNATA: 2.59 kU/L — AB
Maple/Box Elder IgE: 0.3 kU/L — AB
Mucor Racemosus IgE: 0.18 kU/L — AB
Mugwort IgE Qn: 0.63 kU/L — AB
Oak, White IgE: 0.59 kU/L — AB
Penicillium Chrysogen IgE: <0.1 kU/L
Sheep Sorrel IgE Qn: 0.55 kU/L — AB
Stemphylium Herbarum IgE: 1.94 kU/L — AB
Sweet gum IgE RAST Ql: <0.1 kU/L
T006-IGE CEDAR, MOUNTAIN: 0.53 kU/L — AB
T008-IGE ELM, AMERICAN: 0.81 kU/L — AB
T041-IGE HICKORY, WHITE: 0.15 kU/L — AB
TIMOTHY IGE: 12.3 kU/L — AB
W001-IGE RAGWEED, SHORT: 0.44 kU/L — AB
W009-IGE PLANTAIN, ENGLISH: 0.51 kU/L — AB
W014-IGE PIGWEED, ROUGH: 0.49 kU/L — AB
W020-IGE NETTLE: 0.34 kU/L — AB
White Mulberry IgE: <0.1 kU/L

## 2018-01-04 MED ORDER — MONTELUKAST SODIUM 10 MG PO TABS: 10.0000 mg | ORAL_TABLET | Freq: Every day | ORAL | 11 refills | Status: DC

## 2018-01-04 MED ORDER — CETIRIZINE HCL 10 MG PO TABS: 10.0000 mg | ORAL_TABLET | Freq: Every day | ORAL | 11 refills | Status: DC

## 2018-01-30 ENCOUNTER — Telehealth: Payer: Self-pay | Admitting: Family Medicine

## 2018-01-30 ENCOUNTER — Ambulatory Visit: Payer: Commercial Managed Care - PPO | Admitting: Family Medicine

## 2018-01-30 NOTE — Progress Notes (Deleted)
No chief complaint on file.   HPI  4 review of systems  No past medical history on file.  Current Outpatient Medications  Medication Sig Dispense Refill  . cetirizine (ZYRTEC) 10 MG tablet Take 1 tablet (10 mg total) by mouth daily. 30 tablet 11  . dicyclomine (BENTYL) 20 MG tablet Take 1 tablet (20 mg total) by mouth 2 (two) times daily. (Patient not taking: Reported on 01/01/2018) 20 tablet 0  . ibuprofen (ADVIL,MOTRIN) 200 MG tablet Take 400 mg by mouth every 6 (six) hours as needed for moderate pain.    . montelukast (SINGULAIR) 10 MG tablet Take 1 tablet (10 mg total) by mouth at bedtime. 30 tablet 11  . mupirocin cream (BACTROBAN) 2 % Apply 1 application topically 2 (two) times daily. (Patient not taking: Reported on 01/01/2018) 15 g 0  . omeprazole (PRILOSEC) 20 MG capsule Take 1 capsule (20 mg total) by mouth daily. (Patient not taking: Reported on 11/24/2017) 15 capsule 0  . ondansetron (ZOFRAN) 4 MG tablet Take 1 tablet (4 mg total) by mouth every 6 (six) hours. (Patient not taking: Reported on 11/24/2017) 8 tablet 0  . sucralfate (CARAFATE) 1 GM/10ML suspension Take 10 mLs (1 g total) by mouth 4 (four) times daily -  with meals and at bedtime. (Patient not taking: Reported on 11/24/2017) 420 mL 0   No current facility-administered medications for this visit.     Allergies:  Allergies  Allergen Reactions  . Grass Extracts [Gramineae Pollens]     Sneezing     No past surgical history on file.  Social History   Socioeconomic History  . Marital status: Single    Spouse name: Not on file  . Number of children: Not on file  . Years of education: Not on file  . Highest education level: Not on file  Occupational History  . Not on file  Social Needs  . Financial resource strain: Not on file  . Food insecurity:    Worry: Not on file    Inability: Not on file  . Transportation needs:    Medical: Not on file    Non-medical: Not on file  Tobacco Use  . Smoking status:  Never Smoker  . Smokeless tobacco: Never Used  Substance and Sexual Activity  . Alcohol use: No  . Drug use: No  . Sexual activity: Not Currently  Lifestyle  . Physical activity:    Days per week: Not on file    Minutes per session: Not on file  . Stress: Not on file  Relationships  . Social connections:    Talks on phone: Not on file    Gets together: Not on file    Attends religious service: Not on file    Active member of club or organization: Not on file    Attends meetings of clubs or organizations: Not on file    Relationship status: Not on file  Other Topics Concern  . Not on file  Social History Narrative  . Not on file    Family History  Problem Relation Age of Onset  . Hypertension Mother      ROS Review of Systems See HPI Constitution: No fevers or chills No malaise No diaphoresis Skin: No rash or itching Eyes: no blurry vision, no double vision GU: no dysuria or hematuria Neuro: no dizziness or headaches * all others reviewed and negative   Objective: There were no vitals filed for this visit.  Physical Exam  Assessment and Plan  There are no diagnoses linked to this encounter.   Jerlisa Diliberto P [employer]

## 2018-01-30 NOTE — Telephone Encounter (Signed)
Called patient LVM letting patient know we had to cancel her appt due to provider leaving the office early today due to illness.  Patient needs to make appt with Dr Creta LevinStallings on her next opening day.

## 2018-01-31 ENCOUNTER — Ambulatory Visit: Payer: Self-pay | Admitting: *Deleted

## 2018-01-31 NOTE — Telephone Encounter (Signed)
Patient is calling to reports that she is having prolonged bleeding that has lasted since 01/16/18. Patient reports she does miss cycles and have irregular bleeding. She does have good cycle control with OCP. Patient reports she is beginning to feel bad and she has started iron supplementation.  Reason for Disposition . [1] Periods with > 6 soaked pads or tampons per day AND [2] last > 7 days  Answer Assessment - Initial Assessment Questions 1. AMOUNT: "Describe the bleeding that you are having."    - SPOTTING: spotting, or pinkish / brownish mucous discharge; does not fill panti-liner or pad    - MILD:  less than 1 pad / hour; less than patient's usual menstrual bleeding   - MODERATE: 1-2 pads / hour; 1 menstrual cup every 6 hours; small-medium blood clots (e.g., pea, grape, small coin)   - SEVERE: soaking 2 or more pads/hour for 2 or more hours; 1 menstrual cup every 2 hours; bleeding not contained by pads or continuous red blood from vagina; large blood clots (e.g., golf ball, large coin)      Prolonged bleeding- mild 2. ONSET: "When did the bleeding begin?" "Is it continuing now?"     01/16/18- picked up and became heavy on 01/20/18-  continuing today 3. MENSTRUAL PERIOD: "When was the last normal menstrual period?" "How is this different than your period?"     September- patient skips cycles 4. REGULARITY: "How regular are your periods?"     Irregular cycles- may go for 3 months without cycles 5. ABDOMINAL PAIN: "Do you have any pain?" "How bad is the pain?"  (e.g., Scale 1-10; mild, moderate, or severe)   - MILD (1-3): doesn't interfere with normal activities, abdomen soft and not tender to touch    - MODERATE (4-7): interferes with normal activities or awakens from sleep, tender to touch    - SEVERE (8-10): excruciating pain, doubled over, unable to do any normal activities      Intermittent cramping/discomfort- mild 6. PREGNANCY: "Could you be pregnant?" "Are you sexually active?" "Did  you recently give birth?"     No- not sexually active, no 7. BREASTFEEDING: "Are you breastfeeding?"     no 8. HORMONES: "Are you taking any hormone medications, prescription or OTC?" (e.g., birth control pills, estrogen)     no 9. BLOOD THINNERS: "Do you take any blood thinners?" (e.g., Coumadin/warfarin, Pradaxa/dabigatran, aspirin)     no 10. CAUSE: "What do you think is causing the bleeding?" (e.g., recent gyn surgery, recent gyn procedure; known bleeding disorder, cervical cancer, polycystic ovarian disease, fibroids)         no 11. HEMODYNAMIC STATUS: "Are you weak or feeling lightheaded?" If so, ask: "Can you stand and walk normally?"        Some wekness 12. OTHER SYMPTOMS: "What other symptoms are you having with the bleeding?" (e.g., passed tissue, vaginal discharge, fever, menstrual-type cramps)       headache  Protocols used: VAGINAL BLEEDING - ABNORMAL-A-AH

## 2018-02-01 ENCOUNTER — Other Ambulatory Visit: Payer: Self-pay

## 2018-02-01 ENCOUNTER — Ambulatory Visit (INDEPENDENT_AMBULATORY_CARE_PROVIDER_SITE_OTHER): Payer: Commercial Managed Care - PPO | Admitting: Family Medicine

## 2018-02-01 ENCOUNTER — Encounter: Payer: Self-pay | Admitting: Family Medicine

## 2018-02-01 VITALS — BP 108/72 | HR 88 | Temp 98.6°F | Ht 60.0 in | Wt 184.4 lb

## 2018-02-01 DIAGNOSIS — L659 Nonscarring hair loss, unspecified: Secondary | ICD-10-CM

## 2018-02-01 DIAGNOSIS — R6889 Other general symptoms and signs: Secondary | ICD-10-CM

## 2018-02-01 DIAGNOSIS — N938 Other specified abnormal uterine and vaginal bleeding: Secondary | ICD-10-CM

## 2018-02-01 DIAGNOSIS — N921 Excessive and frequent menstruation with irregular cycle: Secondary | ICD-10-CM

## 2018-02-01 DIAGNOSIS — E049 Nontoxic goiter, unspecified: Secondary | ICD-10-CM

## 2018-02-01 LAB — POCT URINE PREGNANCY: Preg Test, Ur: NEGATIVE

## 2018-02-01 MED ORDER — NORGESTIMATE-ETH ESTRADIOL 0.25-35 MG-MCG PO TABS: 1.0000 | ORAL_TABLET | Freq: Every day | ORAL | 11 refills | Status: DC

## 2018-02-01 NOTE — Progress Notes (Addendum)
Subjective:  By signing my name below, I, Misty Estrada, attest that this documentation has been prepared under the direction and in the presence of Misty Staggers, MD. Electronically Signed: Stann Estrada, Scribe. 02/01/2018 , 2:25 PM .  Patient was seen in Room 9 .   Patient ID: Misty Estrada, female    DOB: 20-Dec-1995, 22 y.o.   MRN: [id number] Chief Complaint  Patient presents with  . prolong cycle    cycle started on 01/16/2018...still going with spotting    HPI Misty Estrada is a 22 y.o. female Here with prolonged vaginal bleeding. Patient states she had brown discharge initially start on Nov 6th, and then heavier bleeding on Nov 10th. Her bleeding has started to lighten up today. She made her appointment 1 week prior.   She informs having a history of irregular periods for years; prior period was on Sept 13th, lasting for 5 days which is typical. She has talked to a provider regarding irregular periods. She had similar issue in the past about 4 years ago; was placed on birth control and instructed to take iron supplement due to anemia.   Lab Results  Component Value Date   HGB 14.2 12/06/2017   HGB 14.0 10/30/2016   She has been taking OTC [employer] iron supplement. She isn't on birth control now. She is not currently sexually active, and no plans to become sexually active. She denies any known history of thyroid issues; denies any known family history of thyroid issues. She has noticed hair thinning starting about a year ago. She has noticed some skin changes in the past year. She has had fluctuation in her weight too. She denies history of smoking.   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   There are no active problems to display for this patient.  No past medical history on file. No past surgical history on file. Allergies  Allergen Reactions  . Grass Extracts [Gramineae Pollens]     Sneezing    Prior to Admission medications     Medication Sig Start Date End Date Taking? Authorizing Provider  cetirizine (ZYRTEC) 10 MG tablet Take 1 tablet (10 mg total) by mouth daily. 01/04/18   Doristine Bosworth, MD  dicyclomine (BENTYL) 20 MG tablet Take 1 tablet (20 mg total) by mouth 2 (two) times daily. Patient not taking: Reported on 01/01/2018 10/30/16   Demetrios Loll T, PA-C  ibuprofen (ADVIL,MOTRIN) 200 MG tablet Take 400 mg by mouth every 6 (six) hours as needed for moderate pain.    [provider]  montelukast (SINGULAIR) 10 MG tablet Take 1 tablet (10 mg total) by mouth at bedtime. 01/04/18   Doristine Bosworth, MD  mupirocin cream (BACTROBAN) 2 % Apply 1 application topically 2 (two) times daily. Patient not taking: Reported on 01/01/2018 11/24/17   Elpidio Anis, PA-C  omeprazole (PRILOSEC) 20 MG capsule Take 1 capsule (20 mg total) by mouth daily. Patient not taking: Reported on 11/24/2017 10/30/16   Demetrios Loll T, PA-C  ondansetron (ZOFRAN) 4 MG tablet Take 1 tablet (4 mg total) by mouth every 6 (six) hours. Patient not taking: Reported on 11/24/2017 10/30/16   Demetrios Loll T, PA-C  sucralfate (CARAFATE) 1 GM/10ML suspension Take 10 mLs (1 g total) by mouth 4 (four) times daily -  with meals and at bedtime. Patient not taking: Reported on 11/24/2017 10/30/16   Rise Mu, PA-C   Social History   Socioeconomic History  . Marital status:  Single    Spouse name: Not on file  . Number of children: Not on file  . Years of education: Not on file  . Highest education level: Not on file  Occupational History  . Not on file  Social Needs  . Financial resource strain: Not on file  . Food insecurity:    Worry: Not on file    Inability: Not on file  . Transportation needs:    Medical: Not on file    Non-medical: Not on file  Tobacco Use  . Smoking status: Never Smoker  . Smokeless tobacco: Never Used  Substance and Sexual Activity  . Alcohol use: No  . Drug use: No  . Sexual activity: Not  Currently  Lifestyle  . Physical activity:    Days per week: Not on file    Minutes per session: Not on file  . Stress: Not on file  Relationships  . Social connections:    Talks on phone: Not on file    Gets together: Not on file    Attends religious service: Not on file    Active member of club or organization: Not on file    Attends meetings of clubs or organizations: Not on file    Relationship status: Not on file  . Intimate partner violence:    Fear of current or ex partner: Not on file    Emotionally abused: Not on file    Physically abused: Not on file    Forced sexual activity: Not on file  Other Topics Concern  . Not on file  Social History Narrative  . Not on file   Review of Systems  Constitutional: Positive for unexpected weight change. Negative for chills, fatigue and fever.  Respiratory: Negative for cough.   Gastrointestinal: Negative for constipation, diarrhea, nausea and vomiting.  Genitourinary: Positive for menstrual problem and vaginal discharge.  Skin: Negative for rash and wound.  Neurological: Negative for dizziness, weakness and headaches.       Objective:   Physical Exam  Constitutional: She is oriented to person, place, and time. She appears well-developed and well-nourished. No distress.  HENT:  Head: Normocephalic and atraumatic.  Eyes: Pupils are equal, round, and reactive to light. EOM are normal.  Neck: Neck supple. Thyromegaly (R>L lobe) present. No thyroid mass (no nodules appreciated) present.  Cardiovascular: Normal rate.  Pulmonary/Chest: Effort normal. No respiratory distress.  Abdominal: Soft. Bowel sounds are normal. She exhibits no distension. There is no tenderness.  Musculoskeletal: Normal range of motion.  Neurological: She is alert and oriented to person, place, and time.  Skin: Skin is warm and dry.  Psychiatric: She has a normal mood and affect. Her behavior is normal.  Nursing note and vitals reviewed.   Vitals:    02/01/18 1354  BP: 108/72  Pulse: 88  Temp: 98.6 F (37 C)  TempSrc: Oral  SpO2: 98%  Weight: 184 lb 6.4 oz (83.6 kg)  Height: 5' (1.524 m)   Results for orders placed or performed in visit on 02/01/18  POCT urine pregnancy  Result Value Ref Range   Preg Test, Ur Negative Negative       Assessment & Plan:    Caro HightStarlyn Yoakum is a [age] y.o. female DUB (dysfunctional uterine bleeding) - Plan: POCT urine pregnancy, CBC, norgestimate-ethinyl estradiol (ORTHO-CYCLEN, 28,) 0.25-35 MG-MCG tablet Prolonged menstruation - Plan: TSH, norgestimate-ethinyl estradiol (ORTHO-CYCLEN, 28,) 0.25-35 MG-MCG tablet  -Check TSH, CBC to screen for anemia as reported in past.  In office hCG  is negative.  -Recurrent dysfunctional bleeding.  Will start OCPs after discussion of treatment options.  Has had some improvement today.  Potential side effects, risks of OCPs discussed and handout given.  RTC precautions if not improving.  Hair thinning - Plan: TSH Fluctuation of weight - Plan: TSH Enlarged thyroid - Plan: US THYROID, CANCELED: US Soft Tissue Head/Neck  -with irregular menses will check TSH, but also does admit to hair thinning, weight fluctuation, and possible goiter/thyroid exam.  Meds ordered this encounter  Medications  . norgestimate-ethinyl estradiol (ORTHO-CYCLEN, 28,) 0.25-35 MG-MCG tablet    Sig: Take 1 tablet by mouth daily.    Dispense:  1 Package    Refill:  11   Patient Instructions   I will check thyroid testing and ultrasound of thyroid.  Okay to start oral contraceptives as we discussed  - that should help regulate bleeding.  I also also screen for anemia with blood work from today. Return to the clinic or go to the nearest emergency room if any of your symptoms worsen or new symptoms occur.   Oral Contraception Use Oral contraceptive pills (OCPs) are medicines taken to prevent pregnancy. OCPs work by preventing the ovaries from releasing eggs. The hormones in OCPs also cause  the cervical mucus to thicken, preventing the sperm from entering the uterus. The hormones also cause the uterine lining to become thin, not allowing a fertilized egg to attach to the inside of the uterus. OCPs are highly effective when taken exactly as prescribed. However, OCPs do not prevent sexually transmitted diseases (STDs). Safe sex practices, such as using condoms along with an OCP, can help prevent STDs. Before taking OCPs, you may have a physical exam and Pap test. Your health care provider may also order blood tests if necessary. Your health care provider will make sure you are a good candidate for oral contraception. Discuss with your health care provider the possible side effects of the OCP you may be prescribed. When starting an OCP, it can take 2 to 3 months for the body to adjust to the changes in hormone levels in your body. How to take oral contraceptive pills Your health care provider may advise you on how to start taking the first cycle of OCPs. Otherwise, you can:  Start on day 1 of your menstrual period. You will not need any backup contraceptive protection with this start time.  Start on the first Sunday after your menstrual period or the day you get your prescription. In these cases, you will need to use backup contraceptive protection for the first week.  Start the pill at any time of your cycle. If you take the pill within 5 days of the start of your period, you are protected against pregnancy right away. In this case, you will not need a backup form of birth control. If you start at any other time of your menstrual cycle, you will need to use another form of birth control for 7 days. If your OCP is the type called a minipill, it will protect you from pregnancy after taking it for 2 days (48 hours).  After you have started taking OCPs:  If you forget to take 1 pill, take it as soon as you remember. Take the next pill at the regular time.  If you miss 2 or more pills, call  your health care provider because different pills have different instructions for missed doses. Use backup birth control until your next menstrual period starts.  If you use  a 28-day pack that contains inactive pills and you miss 1 of the last 7 pills (pills with no hormones), it will not matter. Throw away the rest of the non-hormone pills and start a new pill pack.  No matter which day you start the OCP, you will always start a new pack on that same day of the week. Have an extra pack of OCPs and a backup contraceptive method available in case you miss some pills or lose your OCP pack. Follow these instructions at home:  Do not smoke.  Always use a condom to protect against STDs. OCPs do not protect against STDs.  Use a calendar to mark your menstrual period days.  Read the information and directions that came with your OCP. Talk to your health care provider if you have questions. Contact a health care provider if:  You develop nausea and vomiting.  You have abnormal vaginal discharge or bleeding.  You develop a rash.  You miss your menstrual period.  You are losing your hair.  You need treatment for mood swings or depression.  You get dizzy when taking the OCP.  You develop acne from taking the OCP.  You become pregnant. Get help right away if:  You develop chest pain.  You develop shortness of breath.  You have an uncontrolled or severe headache.  You develop numbness or slurred speech.  You develop visual problems.  You develop pain, redness, and swelling in the legs. This information is not intended to replace advice given to you by your health care provider. Make sure you discuss any questions you have with your health care provider. Document Released: 02/16/2011 Document Revised: 08/05/2015 Document Reviewed: 08/18/2012 Elsevier Interactive Patient Education  2017 Elsevier Inc.  Dysfunctional Uterine Bleeding Dysfunctional uterine bleeding is abnormal  bleeding from the uterus. Dysfunctional uterine bleeding includes:  A period that comes earlier or later than usual.  A period that is lighter, heavier, or has blood clots.  Bleeding between periods.  Skipping one or more periods.  Bleeding after sexual intercourse.  Bleeding after menopause.  Follow these instructions at home: Pay attention to any changes in your symptoms. Follow these instructions to help with your condition: Eating and drinking  Eat well-balanced meals. Include foods that are high in iron, such as liver, meat, shellfish, green leafy vegetables, and eggs.  If you become constipated: ? Drink plenty of water. ? Eat fruits and vegetables that are high in water and fiber, such as spinach, carrots, raspberries, apples, and mango. Medicines  Take over-the-counter and prescription medicines only as told by your health care provider.  Do not change medicines without talking with your health care provider.  Aspirin or medicines that contain aspirin may make the bleeding worse. Do not take those medicines: ? During the week before your period. ? During your period.  If you were prescribed iron pills, take them as told by your health care provider. Iron pills help to replace iron that your body loses because of this condition. Activity  If you need to change your sanitary pad or tampon more than one time every 2 hours: ? Lie in bed with your feet raised (elevated). ? Place a cold pack on your lower abdomen. ? Rest as much as possible until the bleeding stops or slows down.  Do not try to lose weight until the bleeding has stopped and your blood iron level is back to normal. Other Instructions  For two months, write down: ? When your period starts. ?  When your period ends. ? When any abnormal bleeding occurs. ? What problems you notice.  Keep all follow up visits as told by your health care provider. This is important. Contact a health care provider  if:  You get light-headed or weak.  You have nausea and vomiting.  You cannot eat or drink without vomiting.  You feel dizzy or have diarrhea while you are taking medicines.  You are taking birth control pills or hormones, and you want to change them or stop taking them. Get help right away if:  You develop a fever or chills.  You need to change your sanitary pad or tampon more than one time per hour.  Your bleeding becomes heavier, or your flow contains clots more often.  You develop pain in your abdomen.  You lose consciousness.  You develop a rash. This information is not intended to replace advice given to you by your health care provider. Make sure you discuss any questions you have with your health care provider. Document Released: 02/25/2000 Document Revised: 08/05/2015 Document Reviewed: 05/25/2014 Elsevier Interactive Patient Education  [employer].    If you have lab work done today you will be contacted with your lab results within the next 2 weeks.  If you have not heard from [place] then please contact us. The fastest way to get your results is to register for My Chart.   IF you received an x-ray today, you will receive an invoice from [facility]. Please contact [facility] at [phone] with questions or concerns regarding your invoice.   IF you received labwork today, you will receive an invoice from [place]. Please contact [facility] at [phone] with questions or concerns regarding your invoice.   Our billing staff will not be able to assist you with questions regarding bills from these companies.  You will be contacted with the lab results as soon as they are available. The fastest way to get your results is to activate your My Chart account. Instructions are located on the last page of this paperwork. If you have not heard from [place] regarding the results in 2 weeks, please contact this office.       I personally performed the  services described in this documentation, which was scribed in my presence. The recorded information has been reviewed and considered for accuracy and completeness, addended by me as needed, and agree with information above.  Signed,   Misty Staggers, MD [facility].  02/01/18 3:16 PM

## 2018-02-01 NOTE — Patient Instructions (Addendum)
I will check thyroid testing and ultrasound of thyroid.  Okay to start oral contraceptives as we discussed  - that should help regulate bleeding.  I also also screen for anemia with blood work from today. Return to the clinic or go to the nearest emergency room if any of your symptoms worsen or new symptoms occur.   Oral Contraception Use Oral contraceptive pills (OCPs) are medicines taken to prevent pregnancy. OCPs work by preventing the ovaries from releasing eggs. The hormones in OCPs also cause the cervical mucus to thicken, preventing the sperm from entering the uterus. The hormones also cause the uterine lining to become thin, not allowing a fertilized egg to attach to the inside of the uterus. OCPs are highly effective when taken exactly as prescribed. However, OCPs do not prevent sexually transmitted diseases (STDs). Safe sex practices, such as using condoms along with an OCP, can help prevent STDs. Before taking OCPs, you may have a physical exam and Pap test. Your health care provider may also order blood tests if necessary. Your health care provider will make sure you are a good candidate for oral contraception. Discuss with your health care provider the possible side effects of the OCP you may be prescribed. When starting an OCP, it can take 2 to 3 months for the body to adjust to the changes in hormone levels in your body. How to take oral contraceptive pills Your health care provider may advise you on how to start taking the first cycle of OCPs. Otherwise, you can:  Start on day 1 of your menstrual period. You will not need any backup contraceptive protection with this start time.  Start on the first Sunday after your menstrual period or the day you get your prescription. In these cases, you will need to use backup contraceptive protection for the first week.  Start the pill at any time of your cycle. If you take the pill within 5 days of the start of your period, you are protected against  pregnancy right away. In this case, you will not need a backup form of birth control. If you start at any other time of your menstrual cycle, you will need to use another form of birth control for 7 days. If your OCP is the type called a minipill, it will protect you from pregnancy after taking it for 2 days (48 hours).  After you have started taking OCPs:  If you forget to take 1 pill, take it as soon as you remember. Take the next pill at the regular time.  If you miss 2 or more pills, call your health care provider because different pills have different instructions for missed doses. Use backup birth control until your next menstrual period starts.  If you use a 28-day pack that contains inactive pills and you miss 1 of the last 7 pills (pills with no hormones), it will not matter. Throw away the rest of the non-hormone pills and start a new pill pack.  No matter which day you start the OCP, you will always start a new pack on that same day of the week. Have an extra pack of OCPs and a backup contraceptive method available in case you miss some pills or lose your OCP pack. Follow these instructions at home:  Do not smoke.  Always use a condom to protect against STDs. OCPs do not protect against STDs.  Use a calendar to mark your menstrual period days.  Read the information and directions that came with  your OCP. Talk to your health care provider if you have questions. Contact a health care provider if:  You develop nausea and vomiting.  You have abnormal vaginal discharge or bleeding.  You develop a rash.  You miss your menstrual period.  You are losing your hair.  You need treatment for mood swings or depression.  You get dizzy when taking the OCP.  You develop acne from taking the OCP.  You become pregnant. Get help right away if:  You develop chest pain.  You develop shortness of breath.  You have an uncontrolled or severe headache.  You develop numbness or  slurred speech.  You develop visual problems.  You develop pain, redness, and swelling in the legs. This information is not intended to replace advice given to you by your health care provider. Make sure you discuss any questions you have with your health care provider. Document Released: 02/16/2011 Document Revised: 08/05/2015 Document Reviewed: 08/18/2012 Elsevier Interactive Patient Education  2017 Elsevier Inc.  Dysfunctional Uterine Bleeding Dysfunctional uterine bleeding is abnormal bleeding from the uterus. Dysfunctional uterine bleeding includes:  A period that comes earlier or later than usual.  A period that is lighter, heavier, or has blood clots.  Bleeding between periods.  Skipping one or more periods.  Bleeding after sexual intercourse.  Bleeding after menopause.  Follow these instructions at home: Pay attention to any changes in your symptoms. Follow these instructions to help with your condition: Eating and drinking  Eat well-balanced meals. Include foods that are high in iron, such as liver, meat, shellfish, green leafy vegetables, and eggs.  If you become constipated: ? Drink plenty of water. ? Eat fruits and vegetables that are high in water and fiber, such as spinach, carrots, raspberries, apples, and mango. Medicines  Take over-the-counter and prescription medicines only as told by your health care provider.  Do not change medicines without talking with your health care provider.  Aspirin or medicines that contain aspirin may make the bleeding worse. Do not take those medicines: ? During the week before your period. ? During your period.  If you were prescribed iron pills, take them as told by your health care provider. Iron pills help to replace iron that your body loses because of this condition. Activity  If you need to change your sanitary pad or tampon more than one time every 2 hours: ? Lie in bed with your feet raised (elevated). ? Place a  cold pack on your lower abdomen. ? Rest as much as possible until the bleeding stops or slows down.  Do not try to lose weight until the bleeding has stopped and your blood iron level is back to normal. Other Instructions  For two months, write down: ? When your period starts. ? When your period ends. ? When any abnormal bleeding occurs. ? What problems you notice.  Keep all follow up visits as told by your health care provider. This is important. Contact a health care provider if:  You get light-headed or weak.  You have nausea and vomiting.  You cannot eat or drink without vomiting.  You feel dizzy or have diarrhea while you are taking medicines.  You are taking birth control pills or hormones, and you want to change them or stop taking them. Get help right away if:  You develop a fever or chills.  You need to change your sanitary pad or tampon more than one time per hour.  Your bleeding becomes heavier, or your flow contains  clots more often.  You develop pain in your abdomen.  You lose consciousness.  You develop a rash. This information is not intended to replace advice given to you by your health care provider. Make sure you discuss any questions you have with your health care provider. Document Released: 02/25/2000 Document Revised: 08/05/2015 Document Reviewed: 05/25/2014 Elsevier Interactive Patient Education  [employer].    If you have lab work done today you will be contacted with your lab results within the next 2 weeks.  If you have not heard from [place] then please contact us. The fastest way to get your results is to register for My Chart.   IF you received an x-ray today, you will receive an invoice from [facility]. Please contact [facility] at [phone] with questions or concerns regarding your invoice.   IF you received labwork today, you will receive an invoice from [place]. Please contact [facility] at [phone] with  questions or concerns regarding your invoice.   Our billing staff will not be able to assist you with questions regarding bills from these companies.  You will be contacted with the lab results as soon as they are available. The fastest way to get your results is to activate your My Chart account. Instructions are located on the last page of this paperwork. If you have not heard from [place] regarding the results in 2 weeks, please contact this office.

## 2018-02-02 LAB — CBC
Hematocrit: 31.2 % — ABNORMAL LOW (ref 34.0–46.6)
Hemoglobin: 10.1 g/dL — ABNORMAL LOW (ref 11.1–15.9)
MCH: 28.9 pg (ref 26.6–33.0)
MCHC: 32.4 g/dL (ref 31.5–35.7)
MCV: 89 fL (ref 79–97)
PLATELETS: 380 10*3/uL (ref 150–450)
RBC: 3.49 x10E6/uL — AB (ref 3.77–5.28)
RDW: 13.8 % (ref 12.3–15.4)
WBC: 7.4 10*3/uL (ref 3.4–10.8)

## 2018-02-02 LAB — TSH: TSH: 0.322 u[IU]/mL — ABNORMAL LOW (ref 0.450–4.500)

## 2018-02-05 ENCOUNTER — Ambulatory Visit
Admission: RE | Admit: 2018-02-05 | Discharge: 2018-02-05 | Disposition: A | Discharge status: Home / Self Care | Payer: Commercial Managed Care - PPO | Source: Ambulatory Visit | Attending: Family Medicine | Admitting: Family Medicine

## 2018-02-05 DIAGNOSIS — E049 Nontoxic goiter, unspecified: Secondary | ICD-10-CM

## 2018-02-14 ENCOUNTER — Ambulatory Visit: Payer: Commercial Managed Care - PPO | Admitting: Family Medicine

## 2018-02-25 ENCOUNTER — Telehealth: Payer: Self-pay | Admitting: Family Medicine

## 2018-02-25 NOTE — Telephone Encounter (Signed)
Copied from CRM [phone]. Topic: Quick Communication - See Telephone Encounter >> Feb 25, 2018  7:10 AM Waymon AmatoBurton, Donna F wrote: Pt is currently taking ortho -cyclen birth control and now her period is on and she is having a lot of cramping and would like to talk with  Someone about getting the birth control changed   Best number [phone]

## 2018-02-27 NOTE — Telephone Encounter (Signed)
Pt calling - states she needs to know what to do about her birth control.

## 2018-02-27 NOTE — Telephone Encounter (Signed)
Contacted pt to address her concerns; she states that since she started taking the pill she has daily stomach aches; her period started 02/24/18 and she is having severe cramping; the pt states that Dr Chilton SiGreen told her if she had any of these symptoms, she could be changed to a lower dose pill; she uses [address] [address] [place], [place]; she can be contacted at [phone] and a detailed message can be left; pt last seen by Dr Meredith StaggersJeffrey Greene, Ern[place], 02/01/18; will route to office for final disposition.

## 2018-03-01 ENCOUNTER — Ambulatory Visit: Payer: Self-pay

## 2018-03-01 NOTE — Telephone Encounter (Signed)
Spoke with patient ans she was informed per Dr Neva SeatGreene that it Is not uncommon to have some breakthrough bleeding with initial start of contraceptives but would not expect her to have severe cramping or daily stomachaches.  Would recommend that she come in to be seen to discuss the symptoms further and can review options for contraceptives if those are thought to be the issue.  I am not in the office for the next 1 week, but can meet with Dr. Creta LevinStallings (her PCP) during that time if available.  If acute worsening of abdominal pain, may need to be seen sooner or in ER. Patient ten ask should she stop the birth control, she was informed not to stop the birth control until she is seen due to her problems may or may not be related to the birth controls. But do strongly advise her to schedule and appt as soon as possible and/ or go to the ER if symptoms get worse in the meantime

## 2018-03-01 NOTE — Telephone Encounter (Signed)
Please advise 

## 2018-03-01 NOTE — Telephone Encounter (Addendum)
Is not uncommon to have some breakthrough bleeding with initial start of contraceptives but would not expect her to have severe cramping or daily stomachaches.  Would recommend that she come in to be seen to discuss the symptoms further and can review options for contraceptives if those are thought to be the issue.  I am not in the office for the next 1 week, but can meet with Dr. Creta LevinStallings (her PCP) during that time if available.  If acute worsening of abdominal pain, may need to be seen sooner or in ER.   Additionally see notes from labs sent by MyChart.  She did have an abnormal TSH and CBC  and had recommended to be seen in a week to 10 days for repeat testing.  That can also be done at upcoming visit.

## 2018-03-01 NOTE — Telephone Encounter (Signed)
Patient called and says "I started the birth control and I took 2-3 pills. My stomach started hurting, not like a menstrual cramp pain. I stopped taking them a few days and my stomach stopped hurting. Then, I had my period and had menstrual cramps really bad at a 8.' I asked if the periods were heavy, she says "no, they were actually regular in comparison to what I used to have not being on the pill. I'm heading to the pharmacy now to pick up another pack of pills to start taking today. I want to know if I should take them?" I advised I am not able to advise not to take them, but will send this to the provider for review and someone will call with the recommendation, she verbalized understanding.  Reason for Disposition . [1] Caller has NON-URGENT question AND [2] triager unable to answer question  Answer Assessment - Initial Assessment Questions 1. TYPE: "What is the name of the birth control pill you are using?"     N/A 2. PACK TYPE: "How do you take your birth control pill?"   - 28-Day Cycle/Pack: Takes an active hormone pill days 1-21 and placebo pill on days 21-28   - 28-Day Cycle/21-Day Pack: Takes an active hormone pill days 1-21, followed by a pill-free week   - 68-Month Cycle/Pack: Takes an active pill for 3 months, followed by pill free week or placebo week   - Continuous: Takes an active hormone pill every day, continuously    Continuous 3. INITIATION: "When did you first start taking this birth control pill?"    1 month ago 4. SYMPTOM: "What is the main symptom (or question) you're concerned about?"     Abdominal pain 5. ONSET: "When did the abdominal pain start?"     2-3 days after taking 6. VAGINAL BLEEDING: "Are you having any unusual vaginal bleeding?"   - NONE   - SPOTTING: spotting or pinkish / brownish mucous discharge; does not fill panty-liner or pad   - MILD: less than 1 pad / hour; less than patient's usual menstrual bleeding   - MODERATE: 1-2 pads / hour; small-medium  blood clots (e.g., pea, grape, small coin)   - SEVERE: soaking 2 or more pads/hour for 2 or more hours; bleeding not contained by pads or  tampons     None 7. PAIN: "Is there any pain?" (Scale: 1-10; mild, moderate, severe).     4-5 8. MISSED/LATE PILLS: "Did you miss or take any pills late?" If yes, "When? How many pills?"     Missed 3-4 pills because of the abdominal pain 9. PREGNANCY: "Are you concerned you might be pregnant?" "When was your last menstrual  period?"     No; coming off now  Protocols used: CONTRACEPTION - BIRTH CONTROL PILLS - COMBINED-A-AH

## 2018-03-02 NOTE — Telephone Encounter (Signed)
Could try to restart new pack of pills, but if same sx's return then would recommend follow up to decide if changes needed or further testing.

## 2018-03-04 NOTE — Telephone Encounter (Signed)
Pt sent to schedulers for appt. 

## 2018-03-26 ENCOUNTER — Ambulatory Visit: Payer: Commercial Managed Care - PPO | Admitting: Family Medicine

## 2018-04-01 ENCOUNTER — Other Ambulatory Visit: Payer: Self-pay

## 2018-04-01 ENCOUNTER — Encounter: Payer: Self-pay | Admitting: Family Medicine

## 2018-04-01 ENCOUNTER — Ambulatory Visit (INDEPENDENT_AMBULATORY_CARE_PROVIDER_SITE_OTHER): Payer: Commercial Managed Care - PPO | Admitting: Family Medicine

## 2018-04-01 VITALS — BP 121/79 | HR 78 | Temp 98.8°F | Resp 16 | Ht 60.0 in | Wt 183.4 lb

## 2018-04-01 DIAGNOSIS — H01134 Eczematous dermatitis of left upper eyelid: Secondary | ICD-10-CM

## 2018-04-01 DIAGNOSIS — Z0001 Encounter for general adult medical examination with abnormal findings: Secondary | ICD-10-CM

## 2018-04-01 DIAGNOSIS — H01131 Eczematous dermatitis of right upper eyelid: Secondary | ICD-10-CM

## 2018-04-01 DIAGNOSIS — Z113 Encounter for screening for infections with a predominantly sexual mode of transmission: Secondary | ICD-10-CM | POA: Diagnosis not present

## 2018-04-01 DIAGNOSIS — Z0000 Encounter for general adult medical examination without abnormal findings: Secondary | ICD-10-CM

## 2018-04-01 DIAGNOSIS — Z124 Encounter for screening for malignant neoplasm of cervix: Secondary | ICD-10-CM | POA: Diagnosis not present

## 2018-04-01 DIAGNOSIS — Z3041 Encounter for surveillance of contraceptive pills: Secondary | ICD-10-CM

## 2018-04-01 DIAGNOSIS — E282 Polycystic ovarian syndrome: Secondary | ICD-10-CM

## 2018-04-01 DIAGNOSIS — R7989 Other specified abnormal findings of blood chemistry: Secondary | ICD-10-CM

## 2018-04-01 DIAGNOSIS — H01132 Eczematous dermatitis of right lower eyelid: Secondary | ICD-10-CM | POA: Diagnosis not present

## 2018-04-01 DIAGNOSIS — D649 Anemia, unspecified: Secondary | ICD-10-CM

## 2018-04-01 DIAGNOSIS — H01135 Eczematous dermatitis of left lower eyelid: Secondary | ICD-10-CM

## 2018-04-01 MED ORDER — MONTELUKAST SODIUM 10 MG PO TABS: 10.0000 mg | ORAL_TABLET | Freq: Every day | ORAL | 3 refills | Status: DC

## 2018-04-01 NOTE — Patient Instructions (Addendum)
If you have lab work done today you will be contacted with your lab results within the next 2 weeks.  If you have not heard from [place] then please contact us. The fastest way to get your results is to register for My Chart.   IF you received an x-ray today, you will receive an invoice from [facility]. Please contact [facility] at [phone] with questions or concerns regarding your invoice.   IF you received labwork today, you will receive an invoice from [facility]. Please contact [facility] at [phone] with questions or concerns regarding your invoice.   Our billing staff will not be able to assist you with questions regarding bills from these companies.  You will be contacted with the lab results as soon as they are available. The fastest way to get your results is to activate your My Chart account. Instructions are located on the last page of this paperwork. If you have not heard from [place] regarding the results in 2 weeks, please contact this office.     Polycystic Ovarian Syndrome  Polycystic ovarian syndrome (PCOS) is a common hormonal disorder among women of reproductive age. In most women with PCOS, many small fluid-filled sacs (cysts) grow on the ovaries, and the cysts are not part of a normal menstrual cycle. PCOS can cause problems with your menstrual periods and make it difficult to get pregnant. It can also cause an increased risk of miscarriage with pregnancy. If it is not treated, PCOS can lead to serious health problems, such as diabetes and heart disease. What are the causes? The cause of PCOS is not known, but it may be the result of a combination of certain factors, such as:  Irregular menstrual cycle.  High levels of certain hormones (androgens).  Problems with the hormone that helps to control blood sugar (insulin resistance).  Certain genes. What increases the risk? This condition is more likely to develop in women who have a family history of  PCOS. What are the signs or symptoms? Symptoms of PCOS may include:  Multiple ovarian cysts.  Infrequent periods or no periods.  Periods that are too frequent or too heavy.  Unpredictable periods.  Inability to get pregnant (infertility) because of not ovulating.  Increased growth of hair on the face, chest, stomach, back, thumbs, thighs, or toes.  Acne or oily skin. Acne may develop during adulthood, and it may not respond to treatment.  Pelvic pain.  Weight gain or obesity.  Patches of thickened and dark brown or black skin on the neck, arms, breasts, or thighs (acanthosis nigricans).  Excess hair growth on the face, chest, abdomen, or upper thighs (hirsutism). How is this diagnosed? This condition is diagnosed based on:  Your medical history.  A physical exam, including a pelvic exam. Your health care provider may look for areas of increased hair growth on your skin.  Tests, such as: ? Ultrasound. This may be used to examine the ovaries and the lining of the uterus (endometrium) for cysts. ? Blood tests. These may be used to check levels of sugar (glucose), female hormone (testosterone), and female hormones (estrogen and progesterone) in your blood. How is this treated? There is no cure for PCOS, but treatment can help to manage symptoms and prevent more health problems from developing. Treatment varies depending on:  Your symptoms.  Whether you want to have a baby or whether you need birth control (contraception). Treatment may include nutrition and lifestyle changes along with:  Progesterone hormone  to start a menstrual period.  Birth control pills to help you have regular menstrual periods.  Medicines to make you ovulate, if you want to get pregnant.  Medicine to reduce excessive hair growth.  Surgery, in severe cases. This may involve making small holes in one or both of your ovaries. This decreases the amount of testosterone that your body produces. Follow  these instructions at home:  Take over-the-counter and prescription medicines only as told by your health care provider.  Follow a healthy meal plan. This can help you reduce the effects of PCOS. ? Eat a healthy diet that includes lean proteins, complex carbohydrates, fresh fruits and vegetables, low-fat dairy products, and healthy fats. Make sure to eat enough fiber.  If you are overweight, lose weight as told by your health care provider. ? Losing 10% of your body weight may improve symptoms. ? Your health care provider can determine how much weight loss is best for you and can help you lose weight safely.  Keep all follow-up visits as told by your health care provider. This is important. Contact a health care provider if:  Your symptoms do not get better with medicine.  You develop new symptoms. This information is not intended to replace advice given to you by your health care provider. Make sure you discuss any questions you have with your health care provider. Document Released: 06/23/2004 Document Revised: 10/26/2015 Document Reviewed: 08/15/2015 [employer] Interactive Patient Education  2019 [employer]

## 2018-04-03 LAB — HIV ANTIBODY (ROUTINE TESTING W REFLEX): HIV Screen 4th Generation wRfx: NONREACTIVE

## 2018-04-03 LAB — HEPATITIS B SURFACE ANTIGEN: Hepatitis B Surface Ag: NEGATIVE

## 2018-04-03 LAB — TSH+PRL+FSH+TESTT+LH+DHEA S...
17 HYDROXYPROGESTERONE: 24 ng/dL
Androstenedione: 59 ng/dL (ref 41–262)
DHEA-SO4: 221.1 ug/dL (ref 110.0–431.7)
FSH: 3 m[IU]/mL
LH: 4.8 m[IU]/mL
Prolactin: 17.1 ng/mL (ref 4.8–23.3)
TSH: 0.478 u[IU]/mL (ref 0.450–4.500)
Testosterone, Free: 3.5 pg/mL (ref 0.0–4.2)
Testosterone: 41 ng/dL (ref 8–48)

## 2018-04-03 LAB — HEMOGLOBIN A1C
ESTIMATED AVERAGE GLUCOSE: 117 mg/dL
Hgb A1c MFr Bld: 5.7 % — ABNORMAL HIGH (ref 4.8–5.6)

## 2018-04-03 LAB — CBC
Hematocrit: 34.2 % (ref 34.0–46.6)
Hemoglobin: 10.7 g/dL — ABNORMAL LOW (ref 11.1–15.9)
MCH: 25.9 pg — ABNORMAL LOW (ref 26.6–33.0)
MCHC: 31.3 g/dL — ABNORMAL LOW (ref 31.5–35.7)
MCV: 83 fL (ref 79–97)
Platelets: 510 10*3/uL — ABNORMAL HIGH (ref 150–450)
RBC: 4.13 x10E6/uL (ref 3.77–5.28)
RDW: 13.7 % (ref 11.7–15.4)
WBC: 7.9 10*3/uL (ref 3.4–10.8)

## 2018-04-03 LAB — RPR: RPR: NONREACTIVE

## 2018-04-09 LAB — PAP IG, CT-NG, RFX HPV ASCU
Chlamydia, Nuc. Acid Amp: NEGATIVE
Gonococcus by Nucleic Acid Amp: NEGATIVE

## 2018-04-09 LAB — HPV DNA PROBE HIGH RISK, AMPLIFIED: HPV, high-risk: NEGATIVE

## 2018-04-15 NOTE — Progress Notes (Signed)
Chief Complaint  Patient presents with  . Annual Exam    cpe with pap  . Contraception    pt would like to be switched to a bc pill that allows for a period week    Subjective:  Misty Estrada is a 23 y.o. female here for a health maintenance visit.  Patient is established pt  There are no active problems to display for this patient.  Eczema She continues to have eczema issues with itching and skin changes on her breast, armpit, face She state that she is taking some allergy meds but nothing topical She denies history of asthma  She denies family history of asthma but reports that she gets some allergies to grass She denies food allergies. She states that the eczema was so bad that she also had a bacterial infection of the breast and is very frustrated.   Contraception surveillance No LMP recorded. (Menstrual status: Oral contraceptives). She reports that she was on a continuous OCP She does not like that she does not get a period She reports that she would take it for 3 weeks then take another contraception pack She likes the monthly cycles Otherwise she has no problem with the OCPs   Past Medical History:  Diagnosis Date  . Allergy     No past surgical history on file.   Outpatient Medications Prior to Visit  Medication Sig Dispense Refill  . cetirizine (ZYRTEC) 10 MG tablet Take 1 tablet (10 mg total) by mouth daily. 30 tablet 11  . ibuprofen (ADVIL,MOTRIN) 200 MG tablet Take 400 mg by mouth every 6 (six) hours as needed for moderate pain.    Marland Kitchen norethindrone-ethinyl estradiol-iron (MICROGESTIN FE 1.5/30) 1.5-30 MG-MCG tablet Take 1 tablet by mouth daily.    . norgestimate-ethinyl estradiol (ORTHO-CYCLEN, 28,) 0.25-35 MG-MCG tablet Take 1 tablet by mouth daily. (Patient not taking: Reported on 04/01/2018) 1 Package 11   No facility-administered medications prior to visit.     Allergies  Allergen Reactions  . Grass Extracts [Gramineae Pollens]     Sneezing       Family History  Problem Relation Age of Onset  . Hypertension Mother   . Cancer Paternal Grandmother   . Diabetes Paternal Grandmother   . Diabetes Paternal Grandfather      Health Habits: Dental Exam: up to date Eye Exam: up to date Exercise: 0 times/week on average Current exercise activities: walking/running Diet: balanced  Social History   Socioeconomic History  . Marital status: Single    Spouse name: Not on file  . Number of children: Not on file  . Years of education: Not on file  . Highest education level: Not on file  Occupational History  . Not on file  Social Needs  . Financial resource strain: Not on file  . Food insecurity:    Worry: Not on file    Inability: Not on file  . Transportation needs:    Medical: Not on file    Non-medical: Not on file  Tobacco Use  . Smoking status: Never Smoker  . Smokeless tobacco: Never Used  Substance and Sexual Activity  . Alcohol use: No  . Drug use: No  . Sexual activity: Not Currently  Lifestyle  . Physical activity:    Days per week: Not on file    Minutes per session: Not on file  . Stress: Not on file  Relationships  . Social connections:    Talks on phone: Not on file    Gets  together: Not on file    Attends religious service: Not on file    Active member of club or organization: Not on file    Attends meetings of clubs or organizations: Not on file    Relationship status: Not on file  . Intimate partner violence:    Fear of current or ex partner: Not on file    Emotionally abused: Not on file    Physically abused: Not on file    Forced sexual activity: Not on file  Other Topics Concern  . Not on file  Social History Narrative  . Not on file   Social History   Substance and Sexual Activity  Alcohol Use No   Social History   Tobacco Use  Smoking Status Never Smoker  Smokeless Tobacco Never Used   Social History   Substance and Sexual Activity  Drug Use No    GYN: Sexual  Health Menstrual status: regular menses LMP: No LMP recorded. (Menstrual status: Oral contraceptives). Last pap smear: see HM section History of abnormal pap smears:  Sexually active: with female partner Current contraception:   Health Maintenance: See under health Maintenance activity for review of completion dates as well.  There is no immunization history on file for this patient.    Depression Screen-PHQ2/9 Depression screen [facility] 2/9 04/01/2018 02/01/2018 01/01/2018 01/01/2018  Decreased Interest 0 0 0 0  Down, Depressed, Hopeless 0 0 - 0  PHQ - 2 Score 0 0 0 0     Depression Severity and Treatment Recommendations:  0-4= None  5-9= Mild / Treatment: Support, educate to call if worse; return in one month  10-14= Moderate / Treatment: Support, watchful waiting; Antidepressant or Psycotherapy  15-19= Moderately severe / Treatment: Antidepressant OR Psychotherapy  >= 20 = Major depression, severe / Antidepressant AND Psychotherapy    Review of Systems   ROS  See HPI for ROS as well.  Review of Systems  Constitutional: Negative for activity change, appetite change, chills and fever.  HENT: Negative for congestion, nosebleeds, trouble swallowing and voice change.   Respiratory: Negative for cough, shortness of breath and wheezing.   Gastrointestinal: Negative for diarrhea, nausea and vomiting.  Genitourinary: Negative for difficulty urinating, dysuria, flank pain and hematuria.  Musculoskeletal: Negative for back pain, joint swelling and neck pain.  Neurological: Negative for dizziness, speech difficulty, light-headedness and numbness.  See HPI. All other review of systems negative.    Objective:   Vitals:   04/01/18 1102  BP: 121/79  Pulse: 78  Resp: 16  Temp: 98.8 F (37.1 C)  TempSrc: Oral  SpO2: 98%  Weight: 183 lb 6.4 oz (83.2 kg)  Height: 5' (1.524 m)    Body mass index is 35.82 kg/m.  Physical Exam  BP 121/79 (BP Location: Left Arm, Patient  Position: Sitting, Cuff Size: Normal)   Pulse 78   Temp 98.8 F (37.1 C) (Oral)   Resp 16   Ht 5' (1.524 m)   Wt 183 lb 6.4 oz (83.2 kg)   SpO2 98%   BMI 35.82 kg/m   General Appearance:    Alert, cooperative, no distress, appears stated age  Head:    Normocephalic, without obvious abnormality, atraumatic  Eyes:    PERRL, conjunctiva/corneas clear, EOM's intact, fundi    benign, both eyes  Ears:    Normal TM's and external ear canals, both ears  Nose:   Nares normal, septum midline, mucosa normal, no drainage    or sinus tenderness  Throat:  Lips, mucosa, and tongue normal; teeth and gums normal  Neck:   Supple, symmetrical, trachea midline, no adenopathy;    thyroid:  no enlargement/tenderness/nodules; no carotid   bruit or JVD  Back:     Symmetric, no curvature, ROM normal, no CVA tenderness  Lungs:     Clear to auscultation bilaterally, respirations unlabored  Chest Wall:    No tenderness or deformity   Heart:    Regular rate and rhythm, S1 and S2 normal, no murmur, rub   or gallop  Breast Exam:    No tenderness, masses, or nipple abnormality, skin with excoriation  Abdomen:     Soft, non-tender, bowel sounds active all four quadrants,    no masses, no organomegaly  Genitalia:    Normal female without lesion, discharge or tenderness, pap smear performed, no pelvic masses  Extremities:   Extremities normal, atraumatic, no cyanosis or edema  Pulses:   2+ and symmetric all extremities  Skin:   Dry skin on the upper eye lids, no purulence, with dry skin changes on her breast superior to the nipple, no nipple discharge  Lymph nodes:   Cervical, supraclavicular, and axillary nodes normal  Neurologic:   CNII-XII intact, normal strength, sensation and reflexes    throughout      Assessment/Plan:   Patient was seen for a health maintenance exam.  Counseled the patient on health maintenance issues. Reviewed her health mainteance schedule and ordered appropriate tests (see  orders.) Counseled on regular exercise and weight management. Recommend regular eye exams and dental cleaning.   The following issues were addressed today for health maintenance:   Misty Estrada was seen today for annual exam and contraception.  Diagnoses and all orders for this visit:  Encounter for health maintenance examination in adult- Women's Health Maintenance Plan Advised monthly breast exam and annual mammogram Advised dental exam every six months Discussed stress management Discussed pap smear screening guidelines  PCOS (polycystic ovarian syndrome)- will evaluate for PCOS given patient weight, skin changes and prediabetes  discussed that some women have difficulty with periods  Based on lab results pt does not have PCOS -     TSH+Prl+FSH+TestT+LH+DHEA S... -     Hemoglobin A1c  Eczematous dermatitis of upper and lower eyelids of both eyes- advised follow up with allergy testing She was previously prescribed singulair which she did not take  -     Ambulatory referral to Allergy  Abnormal TSH -     TSH+Prl+FSH+TestT+LH+DHEA S...  Screening for cervical cancer- Discussed cervical cancer screening  As well as screening for HPV Discussed risk factors for cervical cancer  And hpv vaccination records reviewed  -     Pap IG, CT/NG w/ reflex HPV when ASC-U  Mild anemia- mild anemia, advised supplementation with iron  -     CBC  Screen for STD (sexually transmitted disease) -     RPR -     Hepatitis B surface antigen -     HIV Antibody (routine testing w rflx)  Other orders -     montelukast (SINGULAIR) 10 MG tablet; Take 1 tablet (10 mg total) by mouth at bedtime. -     HPV DNA Probe (High Risk)    No follow-ups on file.    Body mass index is 35.82 kg/m.:  Discussed the patient's BMI with patient. The BMI body mass index is 35.82 kg/m.     Future Appointments  Date Time Provider Department Center  05/01/2018  8:30 AM Margo AyePadgett, [place]  Elease Hashimoto, MD [facility] None     Patient Instructions       If you have lab work done today you will be contacted with your lab results within the next 2 weeks.  If you have not heard from [place] then please contact us. The fastest way to get your results is to register for My Chart.   IF you received an x-ray today, you will receive an invoice from [facility]. Please contact [facility] at [phone] with questions or concerns regarding your invoice.   IF you received labwork today, you will receive an invoice from [facility]. Please contact [facility] at [phone] with questions or concerns regarding your invoice.   Our billing staff will not be able to assist you with questions regarding bills from these companies.  You will be contacted with the lab results as soon as they are available. The fastest way to get your results is to activate your My Chart account. Instructions are located on the last page of this paperwork. If you have not heard from [place] regarding the results in 2 weeks, please contact this office.     Polycystic Ovarian Syndrome  Polycystic ovarian syndrome (PCOS) is a common hormonal disorder among women of reproductive age. In most women with PCOS, many small fluid-filled sacs (cysts) grow on the ovaries, and the cysts are not part of a normal menstrual cycle. PCOS can cause problems with your menstrual periods and make it difficult to get pregnant. It can also cause an increased risk of miscarriage with pregnancy. If it is not treated, PCOS can lead to serious health problems, such as diabetes and heart disease. What are the causes? The cause of PCOS is not known, but it may be the result of a combination of certain factors, such as:  Irregular menstrual cycle.  High levels of certain hormones (androgens).  Problems with the hormone that helps to control blood sugar (insulin resistance).  Certain genes. What increases the risk? This condition is more likely to develop in  women who have a family history of PCOS. What are the signs or symptoms? Symptoms of PCOS may include:  Multiple ovarian cysts.  Infrequent periods or no periods.  Periods that are too frequent or too heavy.  Unpredictable periods.  Inability to get pregnant (infertility) because of not ovulating.  Increased growth of hair on the face, chest, stomach, back, thumbs, thighs, or toes.  Acne or oily skin. Acne may develop during adulthood, and it may not respond to treatment.  Pelvic pain.  Weight gain or obesity.  Patches of thickened and dark brown or black skin on the neck, arms, breasts, or thighs (acanthosis nigricans).  Excess hair growth on the face, chest, abdomen, or upper thighs (hirsutism). How is this diagnosed? This condition is diagnosed based on:  Your medical history.  A physical exam, including a pelvic exam. Your health care provider may look for areas of increased hair growth on your skin.  Tests, such as: ? Ultrasound. This may be used to examine the ovaries and the lining of the uterus (endometrium) for cysts. ? Blood tests. These may be used to check levels of sugar (glucose), female hormone (testosterone), and female hormones (estrogen and progesterone) in your blood. How is this treated? There is no cure for PCOS, but treatment can help to manage symptoms and prevent more health problems from developing. Treatment varies depending on:  Your symptoms.  Whether you want to have a baby or whether you need birth control (  contraception). Treatment may include nutrition and lifestyle changes along with:  Progesterone hormone to start a menstrual period.  Birth control pills to help you have regular menstrual periods.  Medicines to make you ovulate, if you want to get pregnant.  Medicine to reduce excessive hair growth.  Surgery, in severe cases. This may involve making small holes in one or both of your ovaries. This decreases the amount of testosterone  that your body produces. Follow these instructions at home:  Take over-the-counter and prescription medicines only as told by your health care provider.  Follow a healthy meal plan. This can help you reduce the effects of PCOS. ? Eat a healthy diet that includes lean proteins, complex carbohydrates, fresh fruits and vegetables, low-fat dairy products, and healthy fats. Make sure to eat enough fiber.  If you are overweight, lose weight as told by your health care provider. ? Losing 10% of your body weight may improve symptoms. ? Your health care provider can determine how much weight loss is best for you and can help you lose weight safely.  Keep all follow-up visits as told by your health care provider. This is important. Contact a health care provider if:  Your symptoms do not get better with medicine.  You develop new symptoms. This information is not intended to replace advice given to you by your health care provider. Make sure you discuss any questions you have with your health care provider. Document Released: 06/23/2004 Document Revised: 10/26/2015 Document Reviewed: 08/15/2015 Elsevier Interactive Patient Education  2019 [employer]

## 2018-05-01 ENCOUNTER — Ambulatory Visit: Payer: Commercial Managed Care - PPO | Admitting: Allergy

## 2018-05-15 ENCOUNTER — Ambulatory Visit: Payer: Self-pay | Admitting: *Deleted

## 2018-05-15 NOTE — Telephone Encounter (Signed)
Message from Elliot Gault sent at 05/15/2018 3:14 PM EST   Summary: menstrual cramping   Relation to pt: self  Call back number: [phone]   Reason for call:  Patient experiencing menstrual cramping and would like to know if she can take ibuprofen with prednisone, please advise          Returned call to pt to ask if ibuprofen and prednisone could be taken together. Advised pt that it is contraindicated for the pt to take the two mediations at the same time. Pt notified that pharmacy can also be contacted for further questions. Understanding verbalized.  Reason for Disposition . Caller has medication question only, adult not sick, and triager answers question  Answer Assessment - Initial Assessment Questions 1. SYMPTOMS: "Do you have any symptoms?"     Menstrual cramping  Protocols used: MEDICATION QUESTION CALL-A-AH

## 2018-05-29 ENCOUNTER — Encounter: Payer: Self-pay | Admitting: Allergy

## 2018-05-29 ENCOUNTER — Other Ambulatory Visit: Payer: Self-pay

## 2018-05-29 ENCOUNTER — Ambulatory Visit (INDEPENDENT_AMBULATORY_CARE_PROVIDER_SITE_OTHER): Payer: Commercial Managed Care - PPO | Admitting: Allergy

## 2018-05-29 VITALS — BP 112/72 | HR 78 | Resp 16 | Ht 60.0 in | Wt 186.0 lb

## 2018-05-29 DIAGNOSIS — T781XXD Other adverse food reactions, not elsewhere classified, subsequent encounter: Secondary | ICD-10-CM

## 2018-05-29 DIAGNOSIS — J3089 Other allergic rhinitis: Secondary | ICD-10-CM

## 2018-05-29 DIAGNOSIS — L2089 Other atopic dermatitis: Secondary | ICD-10-CM

## 2018-05-29 DIAGNOSIS — L2489 Irritant contact dermatitis due to other agents: Secondary | ICD-10-CM | POA: Diagnosis not present

## 2018-05-29 MED ORDER — TRIAMCINOLONE ACETONIDE 0.5 % EX OINT: 1.0000 "application " | TOPICAL_OINTMENT | Freq: Two times a day (BID) | CUTANEOUS | 0 refills | Status: DC

## 2018-05-29 NOTE — Progress Notes (Signed)
New Patient Note  RE: Misty Estrada MRN: [id number] DOB: 1995-08-21 Date of Office Visit: 05/29/2018  Referring provider: Doristine Bosworth, MD Primary care provider: Doristine Bosworth, MD  Chief Complaint: eczema  History of present illness: Misty Estrada is a 23 y.o. female presenting today for consultation for eczema.    She states she has "always dealt with eczema".  However she state she has been having patches come up that does not look like her typical eczema.  She reports the patches have been around her eyes, breast, arms and legs.  The newer patches have been appearing since Aug 2019.  The patches are itchy and more "scabby" and less scaly.  She has been prescribed prednisone which helped with last being about 2 weeks ago.  She has had 3 rounds of prednisone.  She also had a steroid cream (possibly desonide she thinks) as a well as pimecrolimus.  She states that both these options were helping initially but lately has not been helping.  Moisturizes as needed but does not moisturize after bathing daily.  She has not noted anything or foods that make the patches flare.  She has been taking Singulair for the past month which she does feel like it is helpful for her rash.   Her usual eczema is usually on her fingers.    She states for the past 3-5 years she will develop lip swelling and tingling after eating bojangles fries with cajun season.  She states it does not happen every time she eats these fries.  She states she is even gone more than a year without having the lip swelling or tingling eating the fries.  She has had allergy testing within past 6 months (see results below.)   In regards to the food allergy testing she had done she states she eats all of the foods listed without any symptoms except for scallops and tree nuts.  She states she has never had scallops and she does not eat tree nuts regularly.  She does however tolerate dairy, egg, wheat, soy, shrimp, crab, lobster,  peanuts and sesame seed without any problems.  With grass exposure she reports sneezing and generalized itching.    She states she will break out in an itchy rash from it certain hair products and most all fragranced products.  No history of asthma or food allergy.    Review of systems: Review of Systems  Constitutional: Negative for chills, fever and malaise/fatigue.  HENT: Negative for congestion, ear discharge, nosebleeds and sore throat.   Eyes: Negative for pain, discharge and redness.  Respiratory: Negative for cough, shortness of breath and wheezing.   Cardiovascular: Negative for chest pain.  Gastrointestinal: Negative for abdominal pain, constipation, diarrhea, heartburn, nausea and vomiting.  Musculoskeletal: Negative for joint pain.  Skin: Positive for itching and rash.  Neurological: Negative for headaches.    All other systems negative unless noted above in HPI  Past medical history: Past Medical History:  Diagnosis Date  . Allergy   . Eczema     Past surgical history: Past Surgical History:  Procedure Laterality Date  . NO PAST SURGERIES      Family history:  Family History  Problem Relation Age of Onset  . Hypertension Mother   . Allergic rhinitis Mother   . Cancer Paternal Grandmother   . Diabetes Paternal Grandmother   . Diabetes Paternal Grandfather   . Eczema Paternal Aunt   . Urticaria Neg Hx   . Immunodeficiency Neg  Hx   . Atopy Neg Hx   . Asthma Neg Hx   . Angioedema Neg Hx     Social history: She lives in an apartment with carpeting in the bedroom with electric heating and central cooling.  There are no pets in the home.  There is no concern for water damage, mildew or roaches in the home.  She is a Armed forces training and education officer.  She denies a smoking history.  Medication List: Allergies as of 05/29/2018      Reactions   Grass Extracts [gramineae Pollens]    Sneezing       Medication List       Accurate as of May 29, 2018 12:27 PM. Always  use your most recent med list.        cetirizine 10 MG tablet Commonly known as:  ZYRTEC Take 1 tablet (10 mg total) by mouth daily.   ibuprofen 200 MG tablet Commonly known as:  ADVIL,MOTRIN Take 400 mg by mouth every 6 (six) hours as needed for moderate pain.   montelukast 10 MG tablet Commonly known as:  SINGULAIR Take 1 tablet (10 mg total) by mouth at bedtime.   triamcinolone ointment 0.5 % Commonly known as:  KENALOG Apply 1 application topically 2 (two) times daily.       Known medication allergies: Allergies  Allergen Reactions  . Grass Extracts [Gramineae Pollens]     Sneezing      Physical examination: Blood pressure 112/72, pulse 78, resp. rate 16, height 5' (1.524 m), weight 186 lb (84.4 kg), SpO2 98 %.  General: Alert, interactive, in no acute distress. HEENT: PERRLA, TMs pearly gray, turbinates non-edematous without discharge, post-pharynx non erythematous. Neck: Supple without lymphadenopathy. Lungs: Clear to auscultation without wheezing, rhonchi or rales. {no increased work of breathing. CV: Normal S1, S2 without murmurs. Abdomen: Nondistended, nontender. Skin: Small slightly erythematous rough patch on her right upper arm with slight scale. Extremities:  No clubbing, cyanosis or edema. Neuro:   Grossly intact.  Diagnositics/Labs: Labs:  Component     Latest Ref Rng & Units 01/01/2018  Class Description      Comment  Egg White IgE     Class 0/I kU/L 0.30 (A)  Peanut IgE     Class II kU/L 0.61 (A)  Soybean IgE     Class 0/I kU/L 0.19 (A)  Milk IgE     Class 0/I kU/L 0.27 (A)  Clam IgE     Class 0 kU/L <0.10  Shrimp IgE     Class 0/I kU/L 0.13 (A)  Walnut IgE     Class 0/I kU/L 0.15 (A)  Codfish IgE     Class 0 kU/L <0.10  Scallop IgE     Class 0/I kU/L 0.18 (A)  Wheat IgE     Class I kU/L 0.48 (A)  Allergen Corn, IgE     Class I kU/L 0.35 (A)  Sesame Seed IgE     Class II kU/L 1.00 (A)   Component     Latest Ref Rng & Units  01/01/2018  D Pteronyssinus IgE     Class IV kU/L 6.15 (A)  D Farinae IgE     Class IV kU/L 6.01 (A)  Cat Dander IgE     Class II kU/L 0.61 (A)  Dog Dander IgE     Class 0/I kU/L 0.29 (A)  [place] Grass IgE     Class III kU/L 1.95 (A)  Timothy Grass IgE     Class IV  kU/L 12.30 (A)  [place] Grass IgE     Class IV kU/L 5.08 (A)  [place]ia Grass IgE     Class IV kU/L 5.19 (A)  Cockroach, American IgE     Class 0 kU/L <0.10  Penicillium Chrysogen IgE     Class 0 kU/L <0.10  Cladosporium Herbarum IgE     Class I kU/L 0.39 (A)  Aspergillus Fumigatus IgE     Class I kU/L 0.40 (A)  Mucor Racemosus IgE     Class 0/I kU/L 0.18 (A)  Alternaria Alternata IgE     Class III kU/L 2.59 (A)  Stemphylium Herbarum IgE     Class III kU/L 1.94 (A)  Common Silver Charletta Cousin IgE     Class I kU/L 0.54 (A)  Oak, White IgE     Class II kU/L 0.59 (A)  Elm, American IgE     Class II kU/L 0.81 (A)  Maple/Box Elder IgE     Class 0/I kU/L 0.30 (A)  Hickory, White IgE     Class 0/I kU/L 0.15 (A)  Amer Sycamore IgE Qn     Class II kU/L 0.81 (A)  White Mulberry IgE     Class 0 kU/L <0.10  Sweet gum IgE RAST Ql     Class 0 kU/L <0.10  Cedar, [place] IgE     Class I kU/L 0.53 (A)  Ragweed, Short IgE     Class I kU/L 0.44 (A)  Mugwort IgE Qn     Class II kU/L 0.63 (A)  Plantain, English IgE     Class I kU/L 0.51 (A)  Pigweed, Rough IgE     Class I kU/L 0.49 (A)  Sheep Sorrel IgE Qn     Class I kU/L 0.55 (A)  Nettle IgE     Class I kU/L 0.34 (A)    Assessment and plan:   Dermatitis (eczema and contact dermatitis)  - history of eczema however current skin rash appears different that typical eczema rash  - will have you try Eucrisa, non-steroidal agent, that can be used on areas that are itchy/patchy/dry/inflamed.  Apply thin layer twice a a day as needed.  Can be applied on face and body  - will prescribe a stronger topical steroid, Triamcinolone 0.5% to apply thin layer twice a day to areas as  above.  Use on body only.    - daily moisturization after bathing/showering  - we reviewed your allergy testing and you are eating the majority of the foods that are positive without any issues.  The only foods to be cautious with are tree nuts and mollusks (ie. Scallops, clams, oysters) as these are not commonly eaten in your diet and these had positive IgE levels.   - environmental allergy panel done in Oct 2019 were positive to dust mites, dog, cat, molds, tree pollens, weed pollens, grass pollens.  Allergen avoidance measures provided.    - we discussed contact dermatitis today and to confirm patch testing can be performed.   We use the TRUE test patches that are applied to your back and left in place for 2 days with return to office for readings.  Patches are best placed on Monday with office readings on Wednesday and Friday of same week.  See items in TRUE test below.  If interested in patch testing you can make a patch placement appointment.    Allergic rhinitis  - environmental panel results as above  - continue Singulair  daily - take in evening  -  can use long-acting antihistamine like Zyrtec , Allegra  or Xyzal  daily as needed for general allergy symptom relief   Adverse food reaction  -She has had episodic lip swelling and tingling after eating Bojangles occasion season fries.  This is the only time that she has the lip swelling and tingling.  It also does not happen every time she has these particular fries.  Thus this does not sound like an IgE mediated food allergy to the seasoning/spices.  She also has not had any lip swelling episodes outside of eating these fries so it does seem to be triggered by the fries.  Recommend avoidance of these particular fries and seasoning.  If she ever has swelling outside of this particular food then it would be recommended that she be worked up for hereditary angioedema.  Follow-up 4-6 months or sooner if needed  I appreciate the  opportunity to take part in [place] care. Please do not hesitate to contact me with questions.  Sincerely,   Margo Aye, MD Allergy/Immunology [facility]

## 2018-05-29 NOTE — Patient Instructions (Addendum)
Dermatitis (eczema and contact dermatitis)  - history of eczema however current skin rash appears different that typical eczema rash  - will have you try Eucrisa, non-steroidal agent, that can be used on areas that are itchy/patchy/dry/inflamed.  Apply thin layer twice a a day as needed.  Can be applied on face and body  - will prescribe a stronger topical steroid, Triamcinolone 0.5% to apply thin layer twice a day to areas as above.  Use on body only.    - daily moisturization after bathing/showering  - we reviewed your allergy testing and you are eating the majority of the foods that are positive without any issues.  The only foods to be cautious with are tree nuts and mollusks (ie. Scallops, clams, oysters) as these are not commonly eaten in your diet and these had positive IgE levels.   - environmental allergy panel done in Oct 2019 were positive to dust mites, dog, cat, molds, tree pollens, weed pollens, grass pollens.  Allergen avoidance measures provided.    - we discussed contact dermatitis today and to confirm patch testing can be performed.   We use the TRUE test patches that are applied to your back and left in place for 2 days with return to office for readings.  Patches are best placed on Monday with office readings on Wednesday and Friday of same week.  See items in TRUE test below.  If interested in patch testing you can make a patch placement appointment.    Allergies  - environmental panel results as above  - continue Singulair 10mg  daily - take in evening  - can use long-acting antihistamine like Zyrtec 10mg , Allegra 180mg  or Xyzal 5mg  daily as needed for general allergy symptom relief    Follow-up 4-6 months or sooner if needed  True Test looks for the following sensitivities:

## 2018-10-15 IMAGING — CR DG CHEST 2V
2 series · 2 of 2 positions shown · non-contrast
Comparison: None in PACs

CLINICAL DATA: Mid chest pain and shortness of breath last night.
Some nausea. Nonsmoker.

EXAM:
CHEST  2 VIEW

[w chest pa]
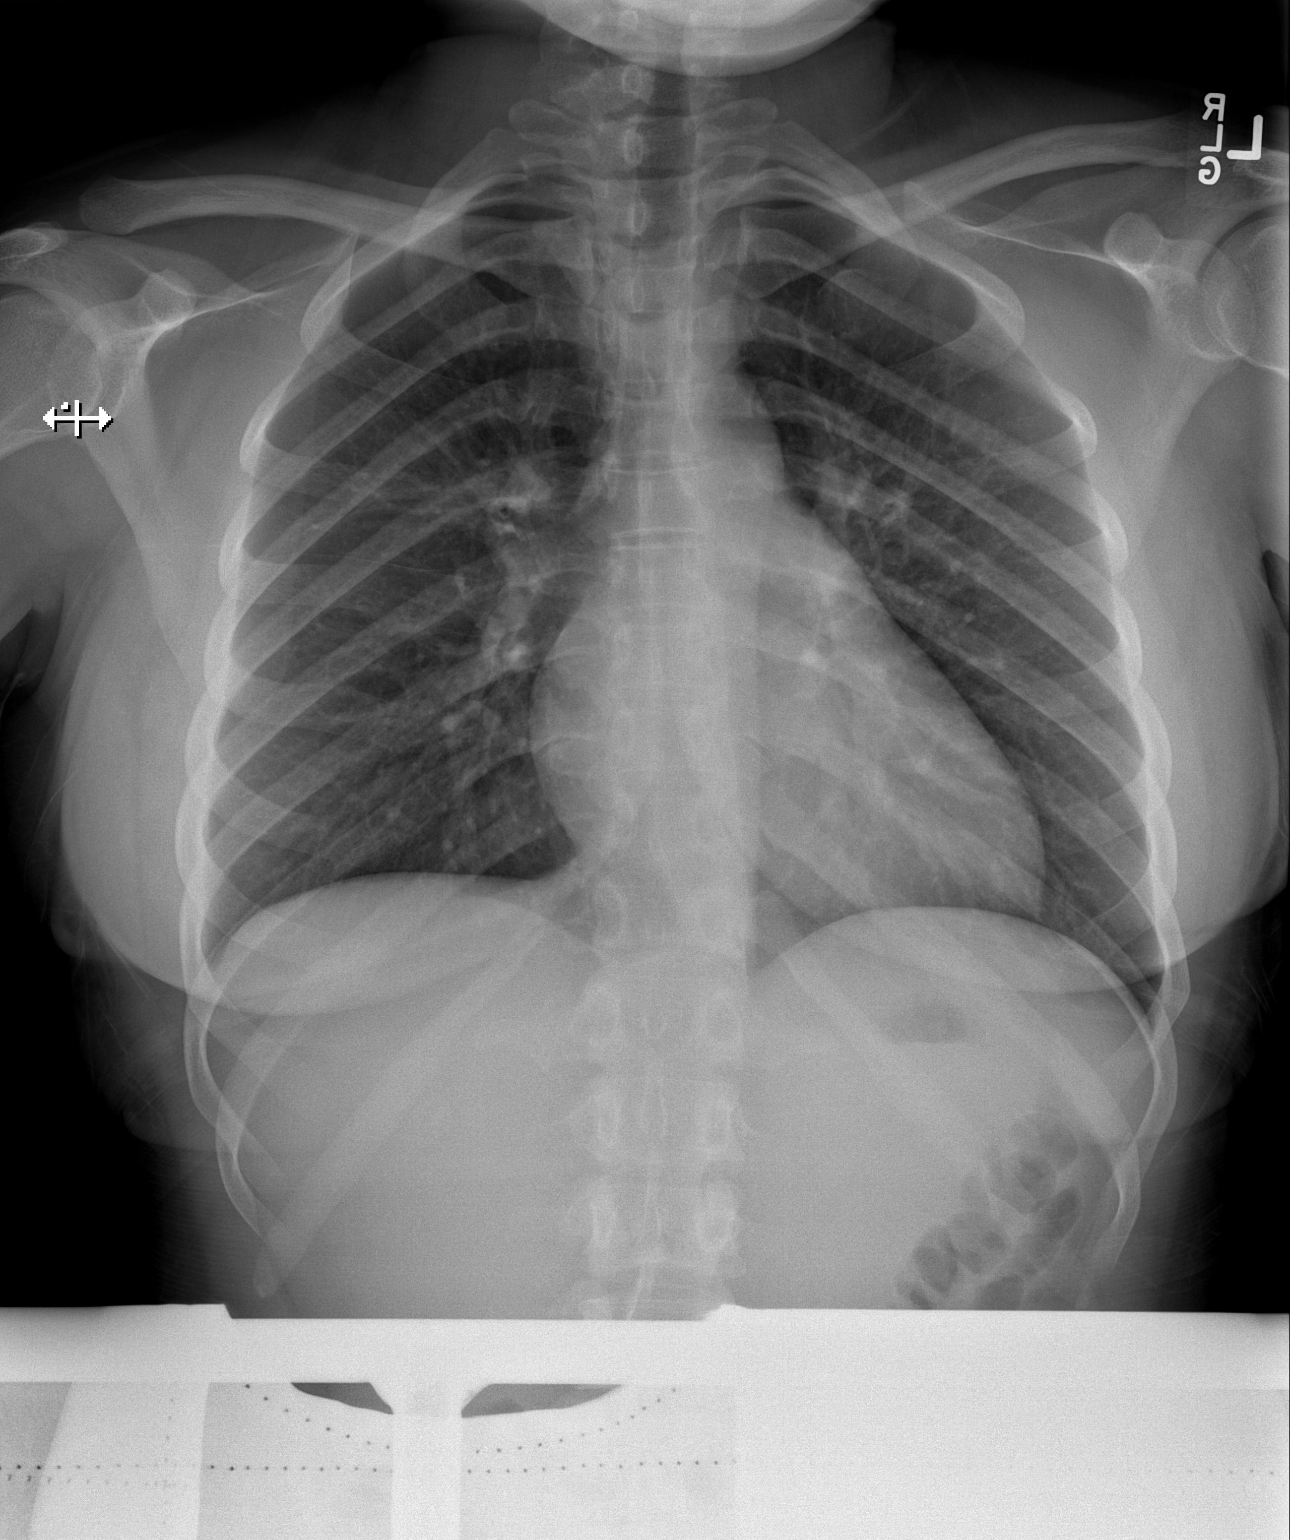

[w chest lat]
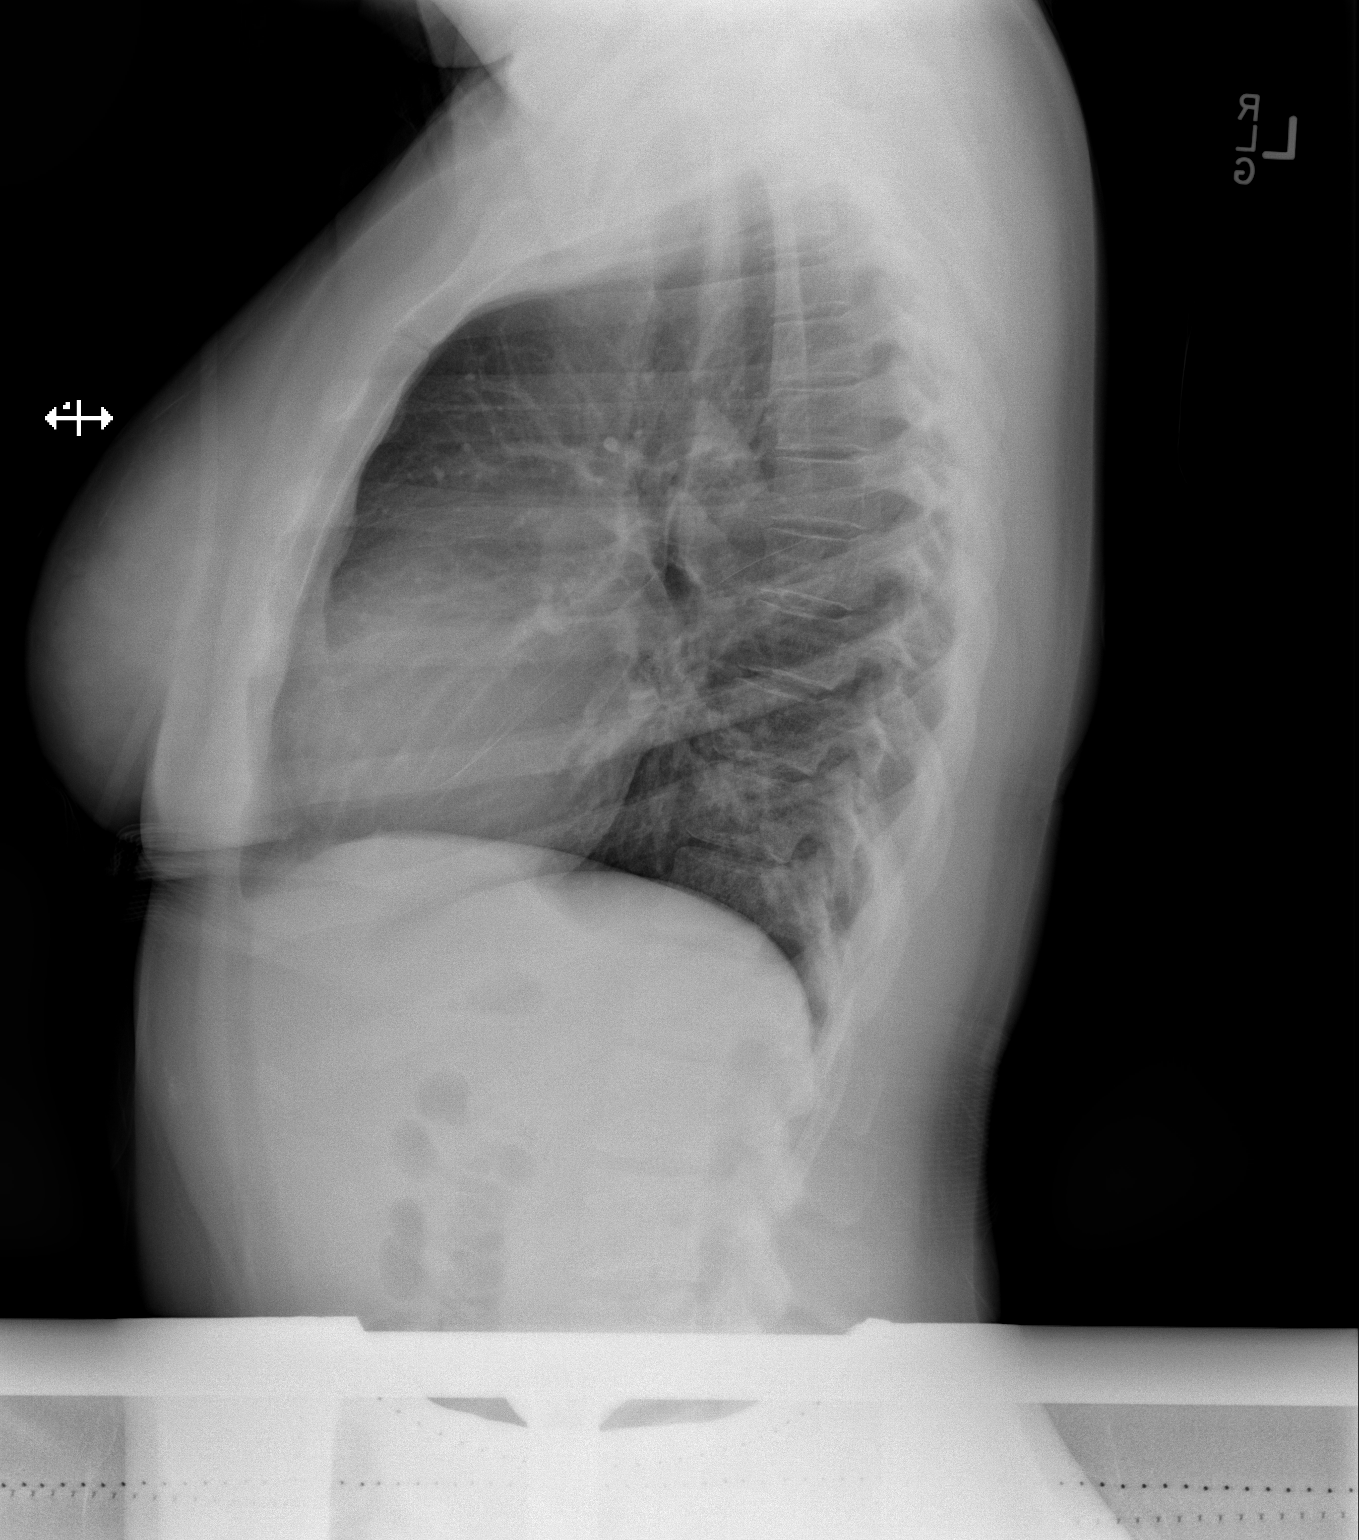

[2 of 2 positions shown; findings below may reference images not displayed]

FINDINGS: The lungs are adequately inflated. The perihilar lung markings are
mildly prominent. There is no alveolar infiltrate or pleural
effusion. The heart and pulmonary vascularity are normal. The
trachea is midline. The bony thorax is unremarkable.
IMPRESSION: No definite acute cardiopulmonary abnormality. Mild perihilar
interstitial prominence may be normal for the patient but could
reflect subsegmental atelectasis as might be seen with acute
bronchitis. Follow-up radiographs are recommended if the patient's
symptoms persist.

## 2019-02-27 ENCOUNTER — Other Ambulatory Visit: Payer: Self-pay

## 2019-02-27 DIAGNOSIS — Z20822 Contact with and (suspected) exposure to covid-19: Secondary | ICD-10-CM

## 2019-02-28 LAB — NOVEL CORONAVIRUS, NAA: SARS-CoV-2, NAA: NOT DETECTED

## 2019-03-25 IMAGING — US US ABDOMEN COMPLETE
1 series · 14 of 25 positions shown · non-contrast
Comparison: None.

CLINICAL DATA: Epigastric and right upper quadrant pain

EXAM:
ABDOMEN ULTRASOUND COMPLETE

[Series 1: us abdomen complete · 0.22mm/px · 14 of 92 slices shown]
[im 1/92]
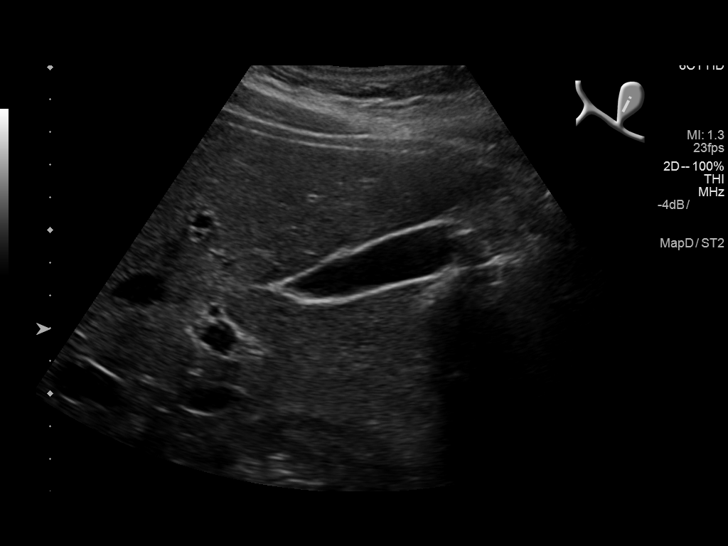
[im 8/92]
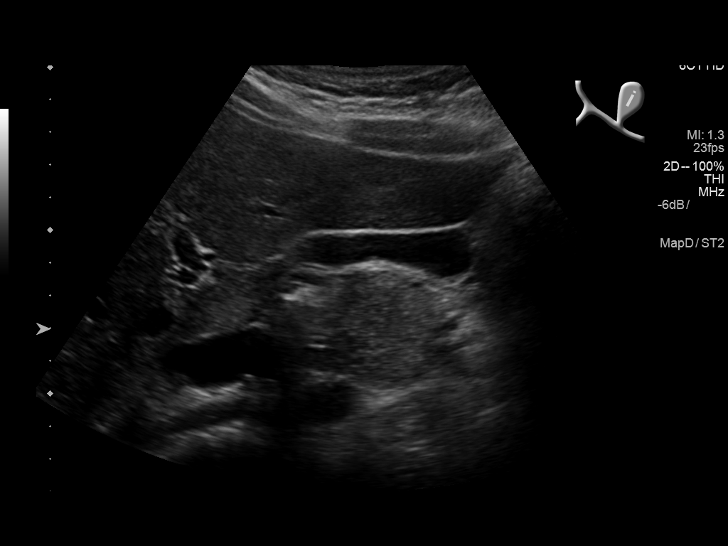
[im 16/92]
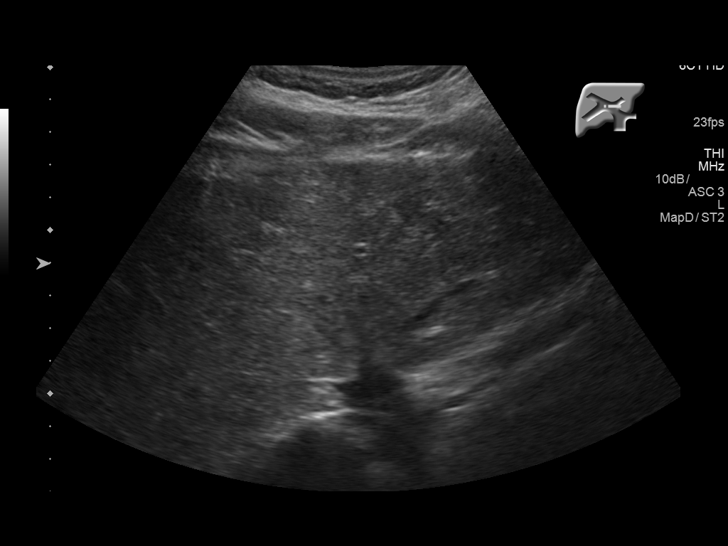
[im 23/92]
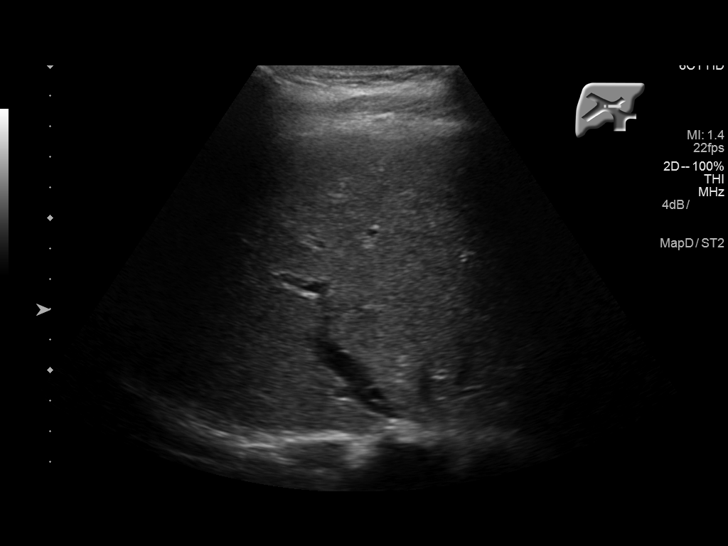
[im 31/92]
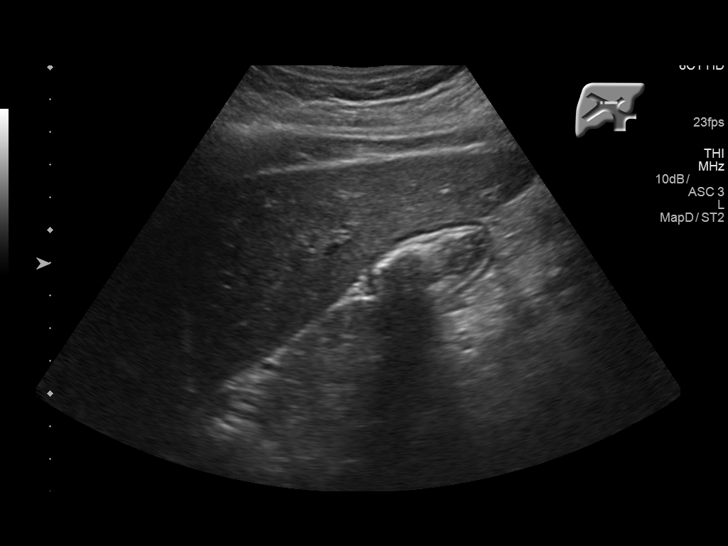
[im 35/92]
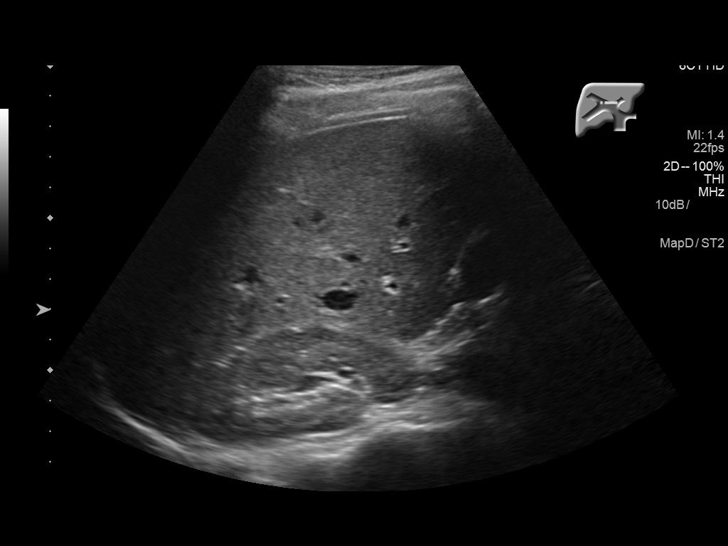
[im 42/92]
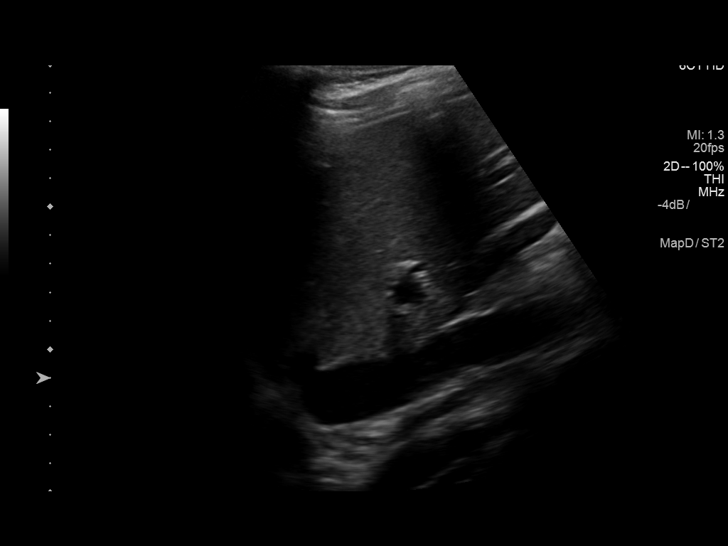
[im 50/92]
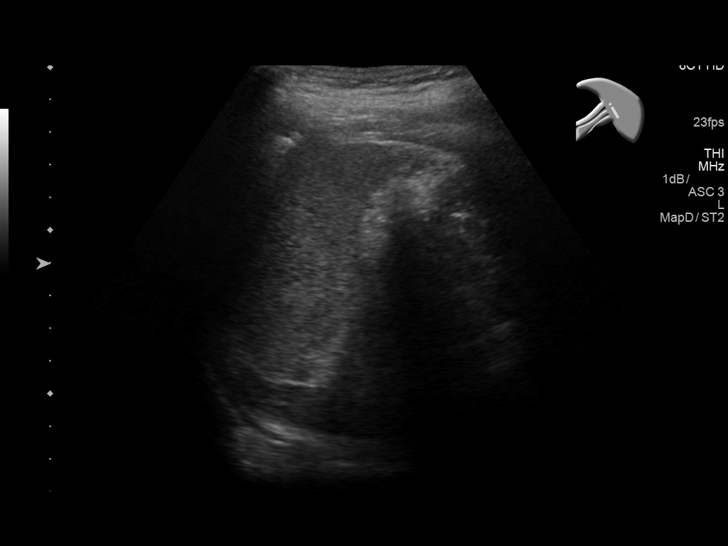
[im 57/92]
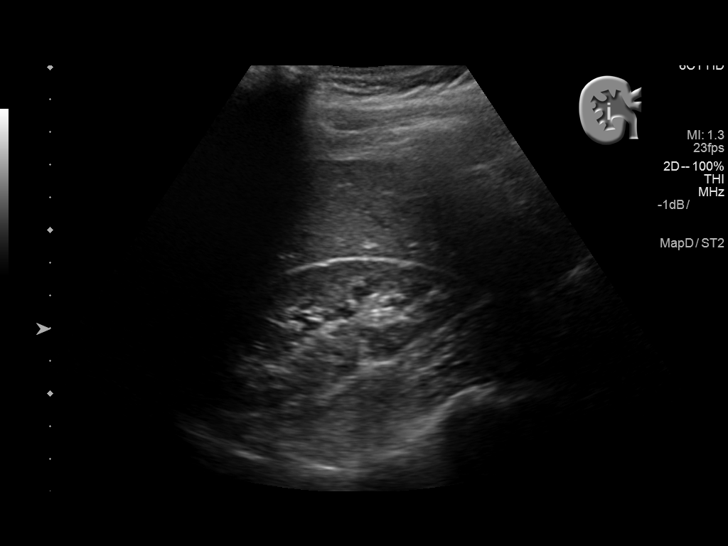
[im 61/92]
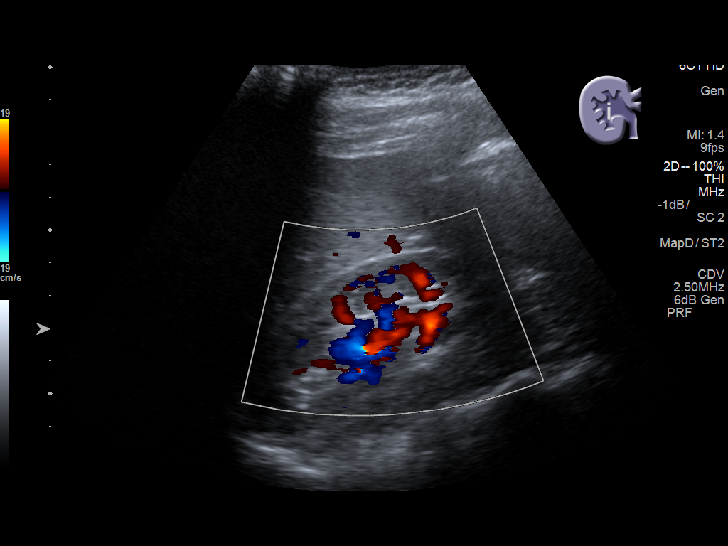
[im 69/92]
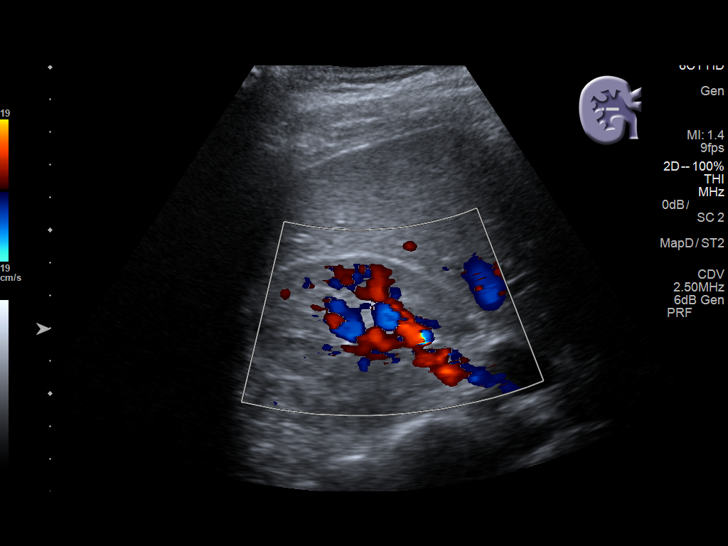
[im 76/92]
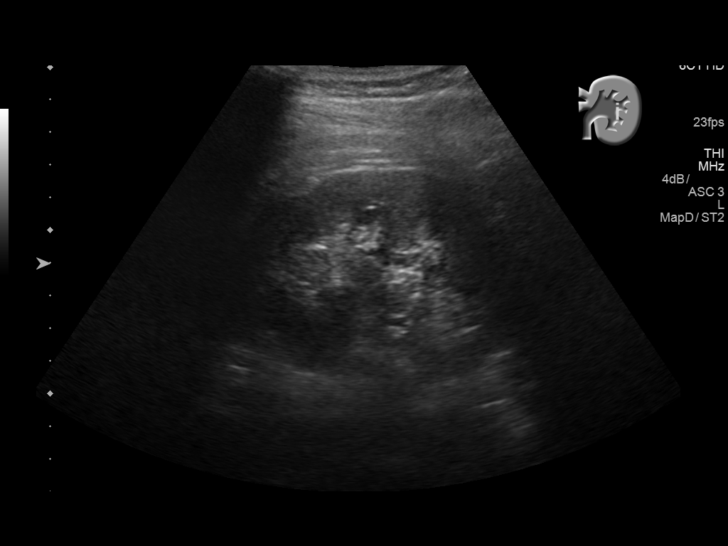
[im 84/92]
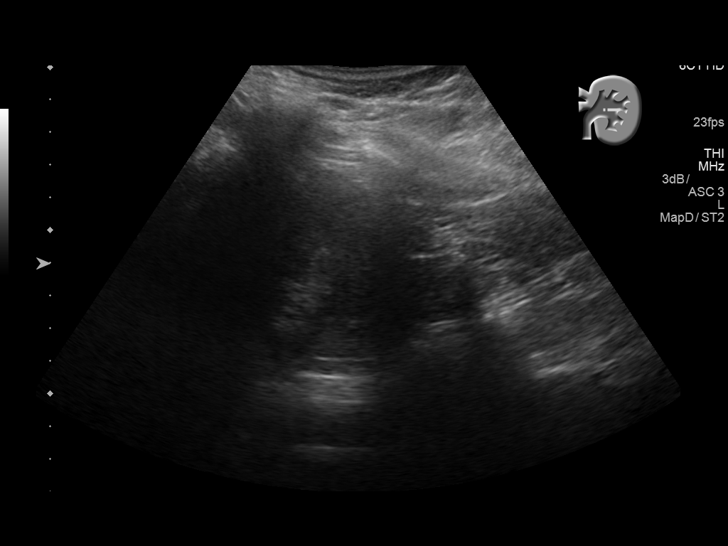
[im 92/92]
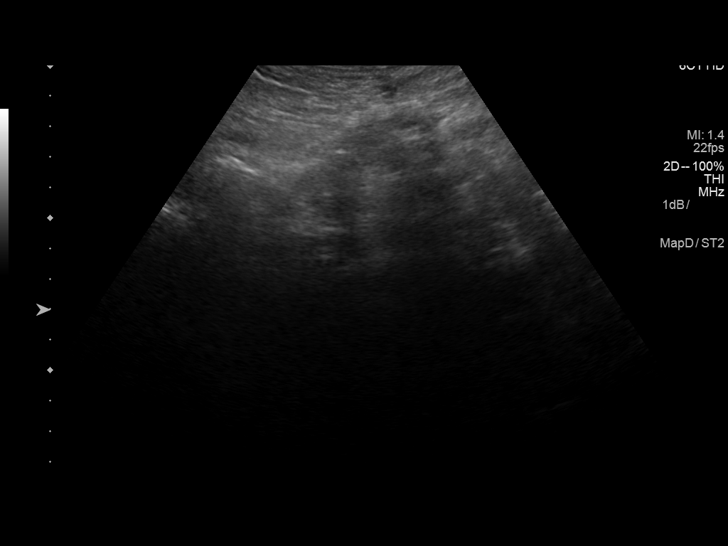

[14 of 25 positions shown; findings below may reference images not displayed]

FINDINGS: Gallbladder: No gallstones or wall thickening visualized. No
sonographic Murphy sign noted by sonographer.

Common bile duct: Diameter: 2.5 mm

Liver: No focal lesion identified. Within normal limits in
parenchymal echogenicity. Portal vein is patent on color Doppler
imaging with normal direction of blood flow towards the liver.

IVC: No abnormality visualized.

Pancreas: Visualized portion unremarkable.

Spleen: Size and appearance within normal limits.

Right Kidney: Length: 9.6 cm. Echogenicity within normal limits. No
mass or hydronephrosis visualized.

Left Kidney: Length: 9.7 cm. Echogenicity within normal limits. No
mass or hydronephrosis visualized.

Abdominal aorta: Obscured by bowel gas.  No aneurysm visualized.

Other findings: No free fluid or ascites.
IMPRESSION: No acute or significant finding by abdominal ultrasound.

## 2019-04-29 ENCOUNTER — Encounter: Payer: Self-pay | Admitting: Family Medicine

## 2019-04-29 ENCOUNTER — Ambulatory Visit (INDEPENDENT_AMBULATORY_CARE_PROVIDER_SITE_OTHER): Payer: Commercial Managed Care - PPO | Admitting: Family Medicine

## 2019-04-29 ENCOUNTER — Other Ambulatory Visit: Payer: Self-pay

## 2019-04-29 VITALS — BP 115/80 | HR 87 | Temp 98.2°F | Resp 17 | Ht 60.0 in | Wt 187.6 lb

## 2019-04-29 DIAGNOSIS — R7303 Prediabetes: Secondary | ICD-10-CM | POA: Diagnosis not present

## 2019-04-29 DIAGNOSIS — R7989 Other specified abnormal findings of blood chemistry: Secondary | ICD-10-CM

## 2019-04-29 DIAGNOSIS — N938 Other specified abnormal uterine and vaginal bleeding: Secondary | ICD-10-CM

## 2019-04-29 DIAGNOSIS — D500 Iron deficiency anemia secondary to blood loss (chronic): Secondary | ICD-10-CM | POA: Diagnosis not present

## 2019-04-29 LAB — POCT CBC
Granulocyte percent: 61.7 %G (ref 37–80)
HCT, POC: 25.7 % — AB (ref 29–41)
Hemoglobin: 8.2 g/dL — AB (ref 11–14.6)
Lymph, poc: 2 (ref 0.6–3.4)
MCH, POC: 22.4 pg — AB (ref 27–31.2)
MCHC: 31.8 g/dL (ref 31.8–35.4)
MCV: 70.3 fL — AB (ref 76–111)
MID (cbc): 0.1 (ref 0–0.9)
MPV: 6.6 fL (ref 0–99.8)
POC Granulocyte: 3.5 (ref 2–6.9)
POC LYMPH PERCENT: 35.7 %L — AB (ref 10–50)
POC MID %: 2.6 %M (ref 0–12)
Platelet Count, POC: 534 10*3/uL — AB (ref 142–424)
RBC: 3.66 M/uL — AB (ref 4.04–5.48)
RDW, POC: 17.6 %
WBC: 5.6 10*3/uL (ref 4.6–10.2)

## 2019-04-29 MED ORDER — FERROUS SULFATE 324 (65 FE) MG PO TBEC: 1.0000 | DELAYED_RELEASE_TABLET | Freq: Two times a day (BID) | ORAL | 6 refills | Status: DC

## 2019-04-29 MED ORDER — MONTELUKAST SODIUM 10 MG PO TABS: 10.0000 mg | ORAL_TABLET | Freq: Every day | ORAL | 6 refills | Status: AC

## 2019-04-29 MED ORDER — MEDROXYPROGESTERONE ACETATE 10 MG PO TABS: 10.0000 mg | ORAL_TABLET | Freq: Every day | ORAL | 0 refills | Status: DC

## 2019-04-29 NOTE — Patient Instructions (Signed)
° ° ° °  If you have lab work done today you will be contacted with your lab results within the next 2 weeks.  If you have not heard from us then please contact us. The fastest way to get your results is to register for My Chart. ° ° °IF you received an x-ray today, you will receive an invoice from [facility]. Please contact [facility] at [phone] with questions or concerns regarding your invoice.  ° °IF you received labwork today, you will receive an invoice from [facility]. Please contact [facility] at [phone] with questions or concerns regarding your invoice.  ° °Our billing staff will not be able to assist you with questions regarding bills from these companies. ° °You will be contacted with the lab results as soon as they are available. The fastest way to get your results is to activate your My Chart account. Instructions are located on the last page of this paperwork. If you have not heard from us regarding the results in 2 weeks, please contact this office. °  ° ° ° °

## 2019-04-29 NOTE — Progress Notes (Signed)
Established Patient Office Visit  Subjective:  Patient ID: Misty Estrada, female    DOB: 01/22/1996  Age: 24 y.o. MRN: [id number]  CC:  Chief Complaint  Patient presents with  . recurring issues with menses f/u  . Medication Refill    montelukast    HPI Misty Estrada presents for   DUB/heavy menses She reports that she started having bleeding 3 weeks ago She reports that she uses pad and is bleeding very heavily and soaks through 4 a day She reports that she is passing clots everytime she urinates She states that the clots are the size of a grape She can also pass clots  She denies history of migraines She has a paternal aunt with breast cancer She denies personal history of VTE She denies mood swings  She denies palpitations, shortness of breath or chest pain She has not taken any iron over the counter   Prediabetes She states that she does light exercise She has a family history in her family with her PGF and her PGM She denies polyuria, polydipsia, polyphagia Lab Results  Component Value Date   HGBA1C 5.7 (H) 04/01/2018   Wt Readings from Last 3 Encounters:  04/29/19 187 lb 9.6 oz (85.1 kg)  05/29/18 186 lb (84.4 kg)  04/01/18 183 lb 6.4 oz (83.2 kg)      Past Medical History:  Diagnosis Date  . Allergy   . Eczema     Past Surgical History:  Procedure Laterality Date  . NO PAST SURGERIES      Family History  Problem Relation Age of Onset  . Hypertension Mother   . Allergic rhinitis Mother   . Cancer Paternal Grandmother   . Diabetes Paternal Grandmother   . Diabetes Paternal Grandfather   . Eczema Paternal Aunt   . Urticaria Neg Hx   . Immunodeficiency Neg Hx   . Atopy Neg Hx   . Asthma Neg Hx   . Angioedema Neg Hx     Social History   Socioeconomic History  . Marital status: Single    Spouse name: Not on file  . Number of children: Not on file  . Years of education: Not on file  . Highest education level: Not on file    Occupational History  . Not on file  Tobacco Use  . Smoking status: Never Smoker  . Smokeless tobacco: Never Used  Substance and Sexual Activity  . Alcohol use: No  . Drug use: No  . Sexual activity: Not Currently  Other Topics Concern  . Not on file  Social History Narrative  . Not on file   Social Determinants of Health   Financial Resource Strain:   . Difficulty of Paying Living Expenses: Not on file  Food Insecurity:   . Worried About Programme researcher, broadcasting/film/video in the Last Year: Not on file  . Ran Out of Food in the Last Year: Not on file  Transportation Needs:   . Lack of Transportation (Medical): Not on file  . Lack of Transportation (Non-Medical): Not on file  Physical Activity:   . Days of Exercise per Week: Not on file  . Minutes of Exercise per Session: Not on file  Stress:   . Feeling of Stress : Not on file  Social Connections:   . Frequency of Communication with Friends and Family: Not on file  . Frequency of Social Gatherings with Friends and Family: Not on file  . Attends Religious Services: Not on file  .  Active Member of Clubs or Organizations: Not on file  . Attends Banker Meetings: Not on file  . Marital Status: Not on file  Intimate Partner Violence:   . Fear of Current or Ex-Partner: Not on file  . Emotionally Abused: Not on file  . Physically Abused: Not on file  . Sexually Abused: Not on file    Outpatient Medications Prior to Visit  Medication Sig Dispense Refill  . ibuprofen (ADVIL,MOTRIN) 200 MG tablet Take 400 mg by mouth every 6 (six) hours as needed for moderate pain.    . cetirizine (ZYRTEC) 10 MG tablet Take 1 tablet (10 mg total) by mouth daily. (Patient not taking: Reported on 04/29/2019) 30 tablet 11  . montelukast (SINGULAIR) 10 MG tablet Take 1 tablet (10 mg total) by mouth at bedtime. (Patient not taking: Reported on 04/29/2019) 30 tablet 3  . triamcinolone ointment (KENALOG) 0.5 % Apply 1 application topically 2 (two) times  daily. (Patient not taking: Reported on 04/29/2019) 30 g 0   No facility-administered medications prior to visit.    Allergies  Allergen Reactions  . Grass Extracts [Gramineae Pollens]     Sneezing     ROS Review of Systems Review of Systems  Constitutional: Negative for activity change, appetite change, chills and fever.  HENT: Negative for congestion, nosebleeds, trouble swallowing and voice change.   Respiratory: Negative for cough, shortness of breath and wheezing.   Gastrointestinal: Negative for diarrhea, nausea and vomiting.  Genitourinary: Negative for difficulty urinating, dysuria, flank pain and hematuria.  Musculoskeletal: Negative for back pain, joint swelling and neck pain.  Neurological: Negative for dizziness, speech difficulty, light-headedness and numbness.  See HPI. All other review of systems negative.     Objective:    Physical Exam  BP 115/80 (BP Location: Left Arm, Patient Position: Sitting, Cuff Size: Normal)   Pulse 87   Temp 98.2 F (36.8 C) (Oral)   Resp 17   Ht 5' (1.524 m)   Wt 187 lb 9.6 oz (85.1 kg)   SpO2 100%   BMI 36.64 kg/m  Wt Readings from Last 3 Encounters:  04/29/19 187 lb 9.6 oz (85.1 kg)  05/29/18 186 lb (84.4 kg)  04/01/18 183 lb 6.4 oz (83.2 kg)   Physical Exam  Constitutional: Oriented to person, place, and time. Appears well-developed and well-nourished.  HENT:  Head: Normocephalic and atraumatic.  Eyes: Conjunctivae and EOM are normal.  Cardiovascular: Normal rate, regular rhythm, normal heart sounds and intact distal pulses.  No murmur heard. Pulmonary/Chest: Effort normal and breath sounds normal. No stridor. No respiratory distress. Has no wheezes.  Abdomen: nondistended, normoactive bs, soft, nontender Neurological: Is alert and oriented to person, place, and time.  Skin: Skin is warm. Capillary refill takes less than 2 seconds.  Psychiatric: Has a normal mood and affect. Behavior is normal. Judgment and thought  content normal.    There are no preventive care reminders to display for this patient.  There are no preventive care reminders to display for this patient.  Lab Results  Component Value Date   TSH 0.478 04/01/2018   Lab Results  Component Value Date   WBC 5.6 04/29/2019   HGB 8.2 (A) 04/29/2019   HCT 25.7 (A) 04/29/2019   MCV 70.3 (A) 04/29/2019   PLT 510 (H) 04/01/2018   Lab Results  Component Value Date   NA 146 (H) 12/06/2017   K 3.9 12/06/2017   CO2 28 12/06/2017   GLUCOSE 86 12/06/2017  BUN 18 12/06/2017   CREATININE 0.91 12/06/2017   BILITOT 0.7 10/30/2016   ALKPHOS 49 10/30/2016   AST 19 10/30/2016   ALT 13 (L) 10/30/2016   PROT 8.1 10/30/2016   ALBUMIN 4.6 10/30/2016   CALCIUM 9.6 12/06/2017   ANIONGAP 9 12/06/2017   No results found for: CHOL No results found for: HDL No results found for: LDLCALC No results found for: TRIG No results found for: CHOLHDL Lab Results  Component Value Date   HGBA1C 5.7 (H) 04/01/2018      Assessment & Plan:   Problem List Items Addressed This Visit    None    Visit Diagnoses    Iron deficiency anemia due to chronic blood loss    -  Primary Reviewed Hemoglobin Advised pt to follow up with Gynecology Discussed common side effects such as GI upset and constipation   Relevant Medications   ferrous sulfate 324 (65 Fe) MG TBEC   Abnormal TSH    -  Will assess since she had a previous abnormal levels and this DUB   Relevant Orders   TSH   DUB (dysfunctional uterine bleeding)    -  Gave provera today and advised to follow up with Gynecology to discuss her DUB    Relevant Orders   POCT CBC (Completed)   Ambulatory referral to Obstetrics / Gynecology   Prediabetes    -  Will reassess   Relevant Orders   Hemoglobin A1c      Meds ordered this encounter  Medications  . medroxyPROGESTERone (PROVERA) 10 MG tablet    Sig: Take 1 tablet (10 mg total) by mouth daily.    Dispense:  10 tablet    Refill:  0  .  montelukast (SINGULAIR) 10 MG tablet    Sig: Take 1 tablet (10 mg total) by mouth at bedtime.    Dispense:  30 tablet    Refill:  6  . ferrous sulfate 324 (65 Fe) MG TBEC    Sig: Take 1 tablet (325 mg total) by mouth in the morning and at bedtime.    Dispense:  60 tablet    Refill:  6    Follow-up: No follow-ups on file.    Forrest Moron, MD

## 2019-04-30 LAB — TSH: TSH: 0.507 u[IU]/mL (ref 0.450–4.500)

## 2019-04-30 LAB — HEMOGLOBIN A1C
Est. average glucose Bld gHb Est-mCnc: 117 mg/dL
Hgb A1c MFr Bld: 5.7 % — ABNORMAL HIGH (ref 4.8–5.6)

## 2019-10-21 ENCOUNTER — Encounter (HOSPITAL_COMMUNITY): Payer: Commercial Managed Care - PPO

## 2019-10-25 NOTE — H&P (Signed)
Misty Estrada is an 24 y.o. G0 female with history of menorrhagia and dysmenorrhea. She had a SIUS on 10/16/19 that showed: intrauterine adhesion anterior to posteriorly and polyp vs intrauterine fibroid along posterior aspect of lining. Pt has agreed to surgical treatment via: hysteroscopy resection of adhesion and myosure on 8/27. All questions regarding surgery, post op expectations and anticipated results answered at preop visit with pt and her mother ( on facetime)  Pertinent Gynecological History: Menses: flow is excessive with use of multiple pads or tampons on heaviest days Bleeding: regular Contraception: none DES exposure: denies Blood transfusions: none Sexually transmitted diseases: no past history Previous GYN Procedures: none  Last mammogram: n/a Date:   Last pap: abnormal: 04/01/18 Date:[facility] History: G0, P    Menstrual History: Menarche age: 72 No LMP recorded. (Menstrual status: Oral contraceptives).    Past Medical History:  Diagnosis Date  . Allergy   . Eczema     Past Surgical History:  Procedure Laterality Date  . NO PAST SURGERIES      Family History  Problem Relation Age of Onset  . Hypertension Mother   . Allergic rhinitis Mother   . Cancer Paternal Grandmother   . Diabetes Paternal Grandmother   . Diabetes Paternal Grandfather   . Eczema Paternal Aunt   . Urticaria Neg Hx   . Immunodeficiency Neg Hx   . Atopy Neg Hx   . Asthma Neg Hx   . Angioedema Neg Hx     Social History:  reports that she has never smoked. She has never used smokeless tobacco. She reports that she does not drink alcohol and does not use drugs.  Allergies:  Allergies  Allergen Reactions  . Grass Extracts [Gramineae Pollens]     Sneezing     No medications prior to admission.    Review of Systems  There were no vitals taken for this visit. Physical Exam  No results found for this or any previous visit (from the past 24 hour(s)).  No results  found.  Assessment/Plan: 24yo G0 female here for hysteroscopy with resection of adhesion and myosure due to uterine mass, dysmenorrhea and menorrhagia - Admit - Sars covid screen - NPO per ERAS - Verify consent - answer questions - To OR when ready ; SCDs to OR  Janean Sark Ethlyn Alto 10/25/2019, 1:19 PM

## 2019-10-30 ENCOUNTER — Other Ambulatory Visit: Payer: Self-pay

## 2019-10-30 ENCOUNTER — Ambulatory Visit (HOSPITAL_COMMUNITY)
Admission: RE | Admit: 2019-10-30 | Discharge: 2019-10-30 | Disposition: A | Discharge status: Home / Self Care | Payer: Commercial Managed Care - PPO | Source: Ambulatory Visit | Attending: Internal Medicine | Admitting: Internal Medicine

## 2019-10-30 ENCOUNTER — Encounter (HOSPITAL_BASED_OUTPATIENT_CLINIC_OR_DEPARTMENT_OTHER): Payer: Self-pay | Admitting: Obstetrics and Gynecology

## 2019-10-30 DIAGNOSIS — D649 Anemia, unspecified: Secondary | ICD-10-CM | POA: Insufficient documentation

## 2019-10-30 MED ORDER — SODIUM CHLORIDE 0.9 % IV SOLN
510.0000 mg | Freq: Once | INTRAVENOUS | Status: AC
  Administered 2019-10-30: 510 mg via INTRAVENOUS
  Filled 2019-10-30: qty 510

## 2019-10-30 MED ORDER — SODIUM CHLORIDE 0.9 % IV SOLN
INTRAVENOUS | Status: DC | PRN
  Administered 2019-10-30: 250 mL via INTRAVENOUS

## 2019-10-30 NOTE — Progress Notes (Signed)
Spoke w/ via phone for pre-op interview---PT Lab needs dos----   Type and screen, urine preg            Lab results------none COVID test ------11-05-2019 at 1500 pm Arrive at -------530 am 11-07-2019 NPO after MN NO Solid Food.  Clear liquids from MN until---430 am then npo Medications to take morning of surgery -----provera Diabetic medication -----n/a Patient Special Instructions -----none Pre-Op special Istructions -----none Patient verbalized understanding of instructions that were given at this phone interview. Patient denies shortness of breath, chest pain, fever, cough at this phone interview.

## 2019-10-30 NOTE — Discharge Instructions (Signed)

## 2019-10-30 NOTE — Progress Notes (Addendum)
PATIENT CARE CENTER NOTE  Diagnosis: Anemia, unspecified   Provider: Pryor Ochoa, MD   Procedure: Duncan Dull IV   Note: Patient received Feraheme infusion via PIV. Tolerated well with no adverse reaction. Observed patient for 30 minutes post infusion. Vital signs stable. Discharge instructions given. Patient to come back next Wednesday for second dose.  Alert, oriented and ambulatory at discharge.

## 2019-11-05 ENCOUNTER — Other Ambulatory Visit: Payer: Self-pay

## 2019-11-05 ENCOUNTER — Ambulatory Visit (HOSPITAL_COMMUNITY)
Admission: RE | Admit: 2019-11-05 | Discharge: 2019-11-05 | Disposition: A | Discharge status: Home / Self Care | Payer: Commercial Managed Care - PPO | Source: Ambulatory Visit | Attending: Internal Medicine | Admitting: Internal Medicine

## 2019-11-05 ENCOUNTER — Other Ambulatory Visit (HOSPITAL_COMMUNITY)
Admission: RE | Admit: 2019-11-05 | Discharge: 2019-11-05 | Disposition: A | Discharge status: Home / Self Care | Payer: Commercial Managed Care - PPO | Source: Ambulatory Visit | Attending: Obstetrics and Gynecology | Admitting: Obstetrics and Gynecology

## 2019-11-05 DIAGNOSIS — Z20822 Contact with and (suspected) exposure to covid-19: Secondary | ICD-10-CM | POA: Insufficient documentation

## 2019-11-05 DIAGNOSIS — Z01812 Encounter for preprocedural laboratory examination: Secondary | ICD-10-CM | POA: Diagnosis not present

## 2019-11-05 DIAGNOSIS — D649 Anemia, unspecified: Secondary | ICD-10-CM | POA: Diagnosis not present

## 2019-11-05 LAB — SARS CORONAVIRUS 2 (TAT 6-24 HRS): SARS Coronavirus 2: NEGATIVE

## 2019-11-05 MED ORDER — SODIUM CHLORIDE 0.9 % IV SOLN
INTRAVENOUS | Status: DC | PRN
  Administered 2019-11-05: 250 mL via INTRAVENOUS

## 2019-11-05 MED ORDER — SODIUM CHLORIDE 0.9 % IV SOLN
510.0000 mg | Freq: Once | INTRAVENOUS | Status: AC
  Administered 2019-11-05: 510 mg via INTRAVENOUS
  Filled 2019-11-05: qty 510

## 2019-11-05 NOTE — Discharge Instructions (Signed)

## 2019-11-05 NOTE — Progress Notes (Signed)
Patient received IV Feraheme as ordered by Pryor Ochoa MD. Observed for at least 30 minutes post infusion.Tolerated well, vitals stable, discharge instructions given, verbalized understanding. Patient alert, oriented and ambulatory at the time of discharge.

## 2019-11-06 NOTE — Anesthesia Preprocedure Evaluation (Addendum)
Anesthesia Evaluation  Patient identified by MRN, date of birth, ID band Patient awake    Reviewed: Allergy & Precautions, NPO status , Patient's Chart, lab work & pertinent test results  History of Anesthesia Complications Negative for: history of anesthetic complications  Airway Mallampati: II  TM Distance: >3 FB Neck ROM: Full    Dental no notable dental hx.    Pulmonary neg pulmonary ROS,    Pulmonary exam normal        Cardiovascular negative cardio ROS Normal cardiovascular exam     Neuro/Psych  Headaches, negative psych ROS   GI/Hepatic Neg liver ROS, GERD  Controlled,  Endo/Other  negative endocrine ROS  Renal/GU negative Renal ROS  negative genitourinary   Musculoskeletal negative musculoskeletal ROS (+)   Abdominal   Peds  Hematology negative hematology ROS (+)   Anesthesia Other Findings Day of surgery medications reviewed with patient.  Reproductive/Obstetrics polyp of corpus uteri, mass                            Anesthesia Physical Anesthesia Plan  ASA: II  Anesthesia Plan: General   Post-op Pain Management:    Induction: Intravenous  PONV Risk Score and Plan: 3 and Treatment may vary due to age or medical condition, Ondansetron, Dexamethasone, Midazolam and Scopolamine patch - Pre-op  Airway Management Planned: LMA  Additional Equipment: None  Intra-op Plan:   Post-operative Plan: Extubation in OR  Informed Consent: I have reviewed the patients History and Physical, chart, labs and discussed the procedure including the risks, benefits and alternatives for the proposed anesthesia with the patient or authorized representative who has indicated his/her understanding and acceptance.     Dental advisory given  Plan Discussed with: CRNA  Anesthesia Plan Comments:        Anesthesia Quick Evaluation

## 2019-11-07 ENCOUNTER — Ambulatory Visit (HOSPITAL_BASED_OUTPATIENT_CLINIC_OR_DEPARTMENT_OTHER): Payer: Commercial Managed Care - PPO | Admitting: Anesthesiology

## 2019-11-07 ENCOUNTER — Other Ambulatory Visit: Payer: Self-pay

## 2019-11-07 ENCOUNTER — Encounter (HOSPITAL_BASED_OUTPATIENT_CLINIC_OR_DEPARTMENT_OTHER): Payer: Self-pay | Admitting: Obstetrics and Gynecology

## 2019-11-07 ENCOUNTER — Ambulatory Visit (HOSPITAL_BASED_OUTPATIENT_CLINIC_OR_DEPARTMENT_OTHER)
Admission: RE | Admit: 2019-11-07 | Discharge: 2019-11-07 | Disposition: A | Discharge status: Home / Self Care | Payer: Commercial Managed Care - PPO | Attending: Obstetrics and Gynecology | Admitting: Obstetrics and Gynecology

## 2019-11-07 ENCOUNTER — Encounter (HOSPITAL_BASED_OUTPATIENT_CLINIC_OR_DEPARTMENT_OTHER)
Admission: RE | Disposition: A | Discharge status: Home / Self Care | Payer: Self-pay | Source: Home / Self Care | Attending: Obstetrics and Gynecology

## 2019-11-07 DIAGNOSIS — N920 Excessive and frequent menstruation with regular cycle: Secondary | ICD-10-CM | POA: Insufficient documentation

## 2019-11-07 DIAGNOSIS — N840 Polyp of corpus uteri: Secondary | ICD-10-CM | POA: Insufficient documentation

## 2019-11-07 DIAGNOSIS — N938 Other specified abnormal uterine and vaginal bleeding: Secondary | ICD-10-CM | POA: Diagnosis present

## 2019-11-07 DIAGNOSIS — N946 Dysmenorrhea, unspecified: Secondary | ICD-10-CM | POA: Insufficient documentation

## 2019-11-07 DIAGNOSIS — Z9889 Other specified postprocedural states: Secondary | ICD-10-CM

## 2019-11-07 HISTORY — DX: Polyp of corpus uteri: N84.0

## 2019-11-07 HISTORY — DX: Unspecified contact dermatitis, unspecified cause: L25.9

## 2019-11-07 HISTORY — PX: DILATATION & CURETTAGE/HYSTEROSCOPY WITH MYOSURE: SHX6511

## 2019-11-07 HISTORY — DX: Gastro-esophageal reflux disease without esophagitis: K21.9

## 2019-11-07 HISTORY — DX: Anemia, unspecified: D64.9

## 2019-11-07 HISTORY — DX: Headache, unspecified: R51.9

## 2019-11-07 LAB — ABO/RH: ABO/RH(D): O POS

## 2019-11-07 LAB — TYPE AND SCREEN
ABO/RH(D): O POS
Antibody Screen: NEGATIVE

## 2019-11-07 LAB — POCT PREGNANCY, URINE: Preg Test, Ur: NEGATIVE

## 2019-11-07 LAB — POCT HEMOGLOBIN-HEMACUE: Hemoglobin: 9.6 g/dL — ABNORMAL LOW (ref 12.0–15.0)

## 2019-11-07 SURGERY — DILATATION & CURETTAGE/HYSTEROSCOPY WITH MYOSURE
Anesthesia: General | Site: Vagina

## 2019-11-07 MED ORDER — SODIUM CHLORIDE 0.9 % IR SOLN
Status: DC | PRN
  Administered 2019-11-07: 3000 mL

## 2019-11-07 MED ORDER — ONDANSETRON HCL 4 MG/2ML IJ SOLN
INTRAMUSCULAR | Status: DC | PRN
  Administered 2019-11-07 (×2): 4 mg via INTRAVENOUS

## 2019-11-07 MED ORDER — MIDAZOLAM HCL 2 MG/2ML IJ SOLN
INTRAMUSCULAR | Status: AC
  Filled 2019-11-07: qty 2

## 2019-11-07 MED ORDER — LIDOCAINE 2% (20 MG/ML) 5 ML SYRINGE
INTRAMUSCULAR | Status: AC
  Filled 2019-11-07: qty 5

## 2019-11-07 MED ORDER — KETOROLAC TROMETHAMINE 30 MG/ML IJ SOLN
INTRAMUSCULAR | Status: AC
  Filled 2019-11-07: qty 1

## 2019-11-07 MED ORDER — ACETAMINOPHEN 500 MG PO TABS: 1000.0000 mg | ORAL_TABLET | ORAL | Status: AC

## 2019-11-07 MED ORDER — SCOPOLAMINE 1 MG/3DAYS TD PT72
MEDICATED_PATCH | TRANSDERMAL | Status: AC
  Filled 2019-11-07: qty 1

## 2019-11-07 MED ORDER — OXYCODONE HCL 5 MG PO TABS: 5.0000 mg | ORAL_TABLET | Freq: Once | ORAL | Status: DC | PRN

## 2019-11-07 MED ORDER — ACETAMINOPHEN 500 MG PO TABS
ORAL_TABLET | ORAL | Status: AC
  Filled 2019-11-07: qty 2

## 2019-11-07 MED ORDER — LIDOCAINE 2% (20 MG/ML) 5 ML SYRINGE
INTRAMUSCULAR | Status: DC | PRN
  Administered 2019-11-07: 100 mg via INTRAVENOUS

## 2019-11-07 MED ORDER — LACTATED RINGERS IV SOLN
INTRAVENOUS | Status: DC
  Administered 2019-11-07: 50 mL via INTRAVENOUS

## 2019-11-07 MED ORDER — FENTANYL CITRATE (PF) 100 MCG/2ML IJ SOLN
INTRAMUSCULAR | Status: AC
  Filled 2019-11-07: qty 2

## 2019-11-07 MED ORDER — DEXAMETHASONE SODIUM PHOSPHATE 10 MG/ML IJ SOLN
INTRAMUSCULAR | Status: AC
  Filled 2019-11-07: qty 1

## 2019-11-07 MED ORDER — OXYCODONE-ACETAMINOPHEN 5-325 MG PO TABS
1.0000 | ORAL_TABLET | ORAL | 0 refills | Status: AC | PRN
Start: 2019-11-07 — End: 2019-11-10

## 2019-11-07 MED ORDER — LIDOCAINE HCL 1 % IJ SOLN
INTRAMUSCULAR | Status: DC | PRN
  Administered 2019-11-07: 10 mL

## 2019-11-07 MED ORDER — DEXAMETHASONE SODIUM PHOSPHATE 10 MG/ML IJ SOLN
INTRAMUSCULAR | Status: DC | PRN
  Administered 2019-11-07: 10 mg via INTRAVENOUS

## 2019-11-07 MED ORDER — ONDANSETRON HCL 4 MG/2ML IJ SOLN
INTRAMUSCULAR | Status: AC
  Filled 2019-11-07: qty 2

## 2019-11-07 MED ORDER — PROPOFOL 10 MG/ML IV BOLUS
INTRAVENOUS | Status: AC
  Filled 2019-11-07: qty 40

## 2019-11-07 MED ORDER — PROPOFOL 500 MG/50ML IV EMUL
INTRAVENOUS | Status: AC
  Filled 2019-11-07: qty 50

## 2019-11-07 MED ORDER — OXYCODONE HCL 5 MG/5ML PO SOLN: 5.0000 mg | Freq: Once | ORAL | Status: DC | PRN

## 2019-11-07 MED ORDER — PROMETHAZINE HCL 25 MG/ML IJ SOLN: 6.2500 mg | INTRAMUSCULAR | Status: DC | PRN

## 2019-11-07 MED ORDER — LACTATED RINGERS IV SOLN: INTRAVENOUS | Status: DC

## 2019-11-07 MED ORDER — ONDANSETRON HCL 4 MG/2ML IJ SOLN
INTRAMUSCULAR | Status: AC
  Filled 2019-11-07: qty 4

## 2019-11-07 MED ORDER — FENTANYL CITRATE (PF) 100 MCG/2ML IJ SOLN
INTRAMUSCULAR | Status: DC | PRN
Start: 2019-11-07 — End: 2019-11-07
  Administered 2019-11-07: 50 ug via INTRAVENOUS

## 2019-11-07 MED ORDER — PROPOFOL 10 MG/ML IV BOLUS
INTRAVENOUS | Status: DC | PRN
  Administered 2019-11-07: 170 mg via INTRAVENOUS

## 2019-11-07 MED ORDER — DEXMEDETOMIDINE (PRECEDEX) IN NS 20 MCG/5ML (4 MCG/ML) IV SYRINGE
PREFILLED_SYRINGE | INTRAVENOUS | Status: AC
  Filled 2019-11-07: qty 10

## 2019-11-07 MED ORDER — KETOROLAC TROMETHAMINE 30 MG/ML IJ SOLN
INTRAMUSCULAR | Status: DC | PRN
  Administered 2019-11-07: 30 mg via INTRAVENOUS

## 2019-11-07 MED ORDER — IBUPROFEN 600 MG PO TABS: 600.0000 mg | ORAL_TABLET | Freq: Four times a day (QID) | ORAL | 0 refills | Status: AC | PRN

## 2019-11-07 MED ORDER — ACETAMINOPHEN 500 MG PO TABS
1000.0000 mg | ORAL_TABLET | Freq: Once | ORAL | Status: AC
  Administered 2019-11-07: 1000 mg via ORAL

## 2019-11-07 MED ORDER — MIDAZOLAM HCL 5 MG/5ML IJ SOLN
INTRAMUSCULAR | Status: DC | PRN
  Administered 2019-11-07: 2 mg via INTRAVENOUS

## 2019-11-07 MED ORDER — POVIDONE-IODINE 10 % EX SWAB: 2.0000 "application " | Freq: Once | CUTANEOUS | Status: DC

## 2019-11-07 MED ORDER — SCOPOLAMINE 1 MG/3DAYS TD PT72
1.0000 | MEDICATED_PATCH | Freq: Once | TRANSDERMAL | Status: DC
  Administered 2019-11-07: 1.5 mg via TRANSDERMAL

## 2019-11-07 MED ORDER — FENTANYL CITRATE (PF) 100 MCG/2ML IJ SOLN
25.0000 ug | INTRAMUSCULAR | Status: DC | PRN
  Administered 2019-11-07: 50 ug via INTRAVENOUS

## 2019-11-07 SURGICAL SUPPLY — 15 items
CATH SILICONE 14FRX5CC (CATHETERS) ×3 IMPLANT
COVER WAND RF STERILE (DRAPES) ×3 IMPLANT
DEVICE MYOSURE REACH (MISCELLANEOUS) ×3 IMPLANT
GAUZE 4X4 16PLY RFD (DISPOSABLE) ×3 IMPLANT
GLOVE BIOGEL PI IND STRL 7.0 (GLOVE) ×2 IMPLANT
GLOVE BIOGEL PI INDICATOR 7.0 (GLOVE) ×4
GLOVE SURG SS PI 6.5 STRL IVOR (GLOVE) ×6 IMPLANT
GOWN STRL REUS W/TWL LRG LVL3 (GOWN DISPOSABLE) ×6 IMPLANT
IV NS IRRIG 3000ML ARTHROMATIC (IV SOLUTION) ×3 IMPLANT
KIT PROCEDURE FLUENT (KITS) ×3 IMPLANT
KIT TURNOVER CYSTO (KITS) ×3 IMPLANT
PACK VAGINAL MINOR WOMEN LF (CUSTOM PROCEDURE TRAY) ×3 IMPLANT
PAD OB MATERNITY 4.3X12.25 (PERSONAL CARE ITEMS) ×3 IMPLANT
SEAL ROD LENS SCOPE MYOSURE (ABLATOR) ×3 IMPLANT
WATER STERILE IRR 500ML POUR (IV SOLUTION) IMPLANT

## 2019-11-07 NOTE — Op Note (Signed)
Operative Note    Preoperative Diagnosis: 1. Dysfunctional uterine bleeding  2. Endometrial fibroid  3. Intrauterine adhesion   Postoperative Diagnosis: 1. Dysfunctional uterine bleeding 2. Endometrial polyp  3. Secretory phase endometrium   Procedure: Hysteroscopy D&C with myosure   Surgeon: Britt Bottom DO  Anesthesia: General  Fluids: LR EBL: <76ml UOP: Fluid Deficit:  Findings: Uterus measured 6cm. Active phase endometrium with one large polyp and a few smaller appearing polyps near fundus. [place] in normal anatomic positions   Specimen: Endometrial polyp and currettings   Procedure Note Patient was taken to the operating room where general anesthesia was administered without difficulty. She was prepped and draped in the normal sterile fashion in the dorsal lithotomy position. An appropriate time out was performed. A speculum was then placed within the vagina and the anterior lip of the cervix identified and grasped with a single toothed tenaculum. 5cc of 1% lidocaine plain was injected at 3 and 9 oclock.. The uterus was then sounded to approximately 6 cm and the [facility] dilators utilized to dilate the cervix up to approximately 18. The Myosure operating scope was then introduced into the cervix and the cavity inspected with findings as previously noted above. The Myosure reach operating blade was then introduced through the scope and under direct visualization the polyps were removed in their entirety. There was no active bleeding at the conclusion of the removal. The operating scope was then removed from the cervix a small curette inserted into the uterine fundus. A gentle curettage was performed in all 4 quadrants to sample any remaining endometrium. All tissue specimens were handed off to pathology. The tenaculum was then removed from the cervix and the site noted to be hemostatic. Finally the speculum was removed from the vagina and the patient awakened and taken to  the recovery room in good condition.  All counts were noted to be correct per nursing personal present

## 2019-11-07 NOTE — Discharge Instructions (Signed)
  Post Anesthesia Home Care Instructions  Activity: Get plenty of rest for the remainder of the day. A responsible adult should stay with you for 24 hours following the procedure.  For the next 24 hours, DO NOT: -Drive a car -Advertising copywriter -Drink alcoholic beverages -Take any medication unless instructed by your physician -Make any legal decisions or sign important papers.  Meals: Start with liquid foods such as gelatin or soup. Progress to regular foods as tolerated. Avoid greasy, spicy, heavy foods. If nausea and/or vomiting occur, drink only clear liquids until the nausea and/or vomiting subsides. Call your physician if vomiting continues.  Special Instructions/Symptoms: Your throat may feel dry or sore from the anesthesia or the breathing tube placed in your throat during surgery. If this causes discomfort, gargle with warm salt water. The discomfort should disappear within 24 hours.  If you had a scopolamine patch placed behind your ear for the management of post- operative nausea and/or vomiting:  1. The medication in the patch is effective for 72 hours, after which it should be removed.  Wrap patch in a tissue and discard in the trash. Wash hands thoroughly with soap and water. 2. You may remove the patch earlier than 72 hours if you experience unpleasant side effects which may include dry mouth, dizziness or visual disturbances. 3. Avoid touching the patch. Wash your hands with soap and water after contact with the patch.   DISCHARGE INSTRUCTIONS: D&C / D&E The following instructions have been prepared to help you care for yourself upon your return home.   Personal hygiene: Marland Kitchen Use sanitary pads for vaginal drainage, not tampons. . Shower the day after your procedure. . NO tub baths, pools or Jacuzzis for 2-3 weeks. . Wipe front to back after using the bathroom.  Activity and limitations: . Do NOT drive or operate any equipment for 24 hours. The effects of anesthesia are  still present and drowsiness may result. . Do NOT rest in bed all day. . Walking is encouraged. . Walk up and down stairs slowly. . You may resume your normal activity in one to two days or as indicated by your physician.  Sexual activity: NO intercourse for at least 2 weeks after the procedure, or as indicated by your physician.  Diet: Eat a light meal as desired this evening. You may resume your usual diet tomorrow.  Return to work: You may resume your work activities in one to two days or as indicated by your doctor.  What to expect after your surgery: Expect to have vaginal bleeding/discharge for 2-3 days and spotting for up to 10 days. It is not unusual to have soreness for up to 1-2 weeks. You may have a slight burning sensation when you urinate for the first day. Mild cramps may continue for a couple of days. You may have a regular period in 2-6 weeks.  Call your doctor for any of the following: . Excessive vaginal bleeding, saturating and changing one pad every hour. . Inability to urinate 6 hours after discharge from hospital. . Pain not relieved by pain medication. . Fever of 100.4 F or greater. . Unusual vaginal discharge or odor.   Call for an appointment:     Call office with any concerns [phone]

## 2019-11-07 NOTE — Anesthesia Procedure Notes (Signed)
Procedure Name: LMA Insertion Date/Time: 11/07/2019 7:42 AM Performed by: Marny Lowenstein, CRNA Pre-anesthesia Checklist: Patient identified, Emergency Drugs available, Suction available and Patient being monitored Patient Re-evaluated:Patient Re-evaluated prior to induction Oxygen Delivery Method: Circle system utilized Preoxygenation: Pre-oxygenation with 100% oxygen Induction Type: IV induction Ventilation: Mask ventilation without difficulty LMA: LMA inserted LMA Size: 4.0 Number of attempts: 1 Placement Confirmation: positive ETCO2 and breath sounds checked- equal and bilateral Tube secured with: Tape Dental Injury: Teeth and Oropharynx as per pre-operative assessment

## 2019-11-07 NOTE — Interval H&P Note (Signed)
History and Physical Interval Note: Pt seen and no change from H/P Reviewed procedure and post op expectations To OR when ready  11/07/2019 7:25 AM  Misty Estrada  has presented today for surgery, with the diagnosis of polyp of corpus uteri, mass.  The various methods of treatment have been discussed with the patient and family. After consideration of risks, benefits and other options for treatment, the patient has consented to  Procedure(s): DILATATION & CURETTAGE/HYSTEROSCOPY WITH MYOSURE, RESECTION OF ADHESION (N/A) as a surgical intervention.  The patient's history has been reviewed, patient examined, no change in status, stable for surgery.  I have reviewed the patient's chart and labs.  Questions were answered to the patient's satisfaction.     Cathrine Muster

## 2019-11-07 NOTE — Anesthesia Postprocedure Evaluation (Signed)
Anesthesia Post Note  Patient: Misty Estrada  Procedure(s) Performed: DILATATION & CURETTAGE/HYSTEROSCOPY WITH MYOSURE (N/A Vagina )     Patient location during evaluation: PACU Anesthesia Type: General Level of consciousness: awake and alert and oriented Pain management: pain level controlled Vital Signs Assessment: post-procedure vital signs reviewed and stable Respiratory status: spontaneous breathing, nonlabored ventilation and respiratory function stable Cardiovascular status: blood pressure returned to baseline Postop Assessment: no apparent nausea or vomiting Anesthetic complications: no   No complications documented.  Last Vitals:  Vitals:   11/07/19 0915 11/07/19 0917  BP:  104/71  Pulse: 76 72  Resp: 15 20  Temp:  36.7 C  SpO2: 100% 98%    Last Pain:  Vitals:   11/07/19 0917  TempSrc: Oral  PainSc:                  Kaylyn Layer

## 2019-11-07 NOTE — Transfer of Care (Signed)
Immediate Anesthesia Transfer of Care Note  Patient: Misty Estrada  Procedure(s) Performed: DILATATION & CURETTAGE/HYSTEROSCOPY WITH MYOSURE (N/A Vagina )  Patient Location: PACU  Anesthesia Type:General  Level of Consciousness: drowsy and patient cooperative  Airway & Oxygen Therapy: Patient Spontanous Breathing  Post-op Assessment: Report given to RN and Post -op Vital signs reviewed and stable  Post vital signs: Reviewed and stable  Last Vitals:  Vitals Value Taken Time  BP 122/76 11/07/19 0828  Temp    Pulse 100 11/07/19 0830  Resp 21 11/07/19 0830  SpO2 100 % 11/07/19 0830  Vitals shown include unvalidated device data.  Last Pain:  Vitals:   11/07/19 0631  TempSrc: Oral  PainSc: 3       Patients Stated Pain Goal: 5 (11/07/19 0631)  Complications: No complications documented.

## 2019-11-10 ENCOUNTER — Encounter (HOSPITAL_BASED_OUTPATIENT_CLINIC_OR_DEPARTMENT_OTHER): Payer: Self-pay | Admitting: Obstetrics and Gynecology

## 2019-11-10 LAB — SURGICAL PATHOLOGY
